# Patient Record
Sex: Female | Born: 1985
Health system: Southern US, Community
[De-identification: ages and names within clinical notes are randomized; demographics above are authoritative.]

## PROBLEM LIST (undated history)

## (undated) ENCOUNTER — Inpatient Hospital Stay (HOSPITAL_COMMUNITY): Payer: Self-pay

## (undated) DIAGNOSIS — K219 Gastro-esophageal reflux disease without esophagitis: Secondary | ICD-10-CM

## (undated) DIAGNOSIS — K8020 Calculus of gallbladder without cholecystitis without obstruction: Secondary | ICD-10-CM

## (undated) DIAGNOSIS — I10 Essential (primary) hypertension: Secondary | ICD-10-CM

---
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
## 2006-05-26 ENCOUNTER — Inpatient Hospital Stay (HOSPITAL_COMMUNITY): Admission: AD | Admit: 2006-05-26 | Discharge: 2006-05-26 | Payer: Self-pay | Admitting: Obstetrics

## 2006-06-18 ENCOUNTER — Ambulatory Visit (HOSPITAL_COMMUNITY): Admission: RE | Admit: 2006-06-18 | Discharge: 2006-06-18 | Payer: Self-pay | Admitting: Obstetrics

## 2006-09-29 ENCOUNTER — Inpatient Hospital Stay (HOSPITAL_COMMUNITY): Admission: AD | Admit: 2006-09-29 | Discharge: 2006-09-29 | Payer: Self-pay | Admitting: Obstetrics

## 2006-10-23 ENCOUNTER — Inpatient Hospital Stay (HOSPITAL_COMMUNITY): Admission: AD | Admit: 2006-10-23 | Discharge: 2006-10-23 | Payer: Self-pay | Admitting: Obstetrics & Gynecology

## 2006-10-25 ENCOUNTER — Inpatient Hospital Stay (HOSPITAL_COMMUNITY): Admission: AD | Admit: 2006-10-25 | Discharge: 2006-10-25 | Payer: Self-pay | Admitting: Obstetrics & Gynecology

## 2006-10-26 ENCOUNTER — Inpatient Hospital Stay (HOSPITAL_COMMUNITY): Admission: AD | Admit: 2006-10-26 | Discharge: 2006-10-29 | Payer: Self-pay | Admitting: Obstetrics & Gynecology

## 2006-10-26 DIAGNOSIS — O321XX0 Maternal care for breech presentation, not applicable or unspecified: Secondary | ICD-10-CM

## 2008-02-28 ENCOUNTER — Emergency Department (HOSPITAL_COMMUNITY): Admission: EM | Admit: 2008-02-28 | Discharge: 2008-02-28 | Payer: Self-pay | Admitting: Emergency Medicine

## 2008-05-01 ENCOUNTER — Inpatient Hospital Stay (HOSPITAL_COMMUNITY): Admission: AD | Admit: 2008-05-01 | Discharge: 2008-05-01 | Payer: Self-pay | Admitting: Obstetrics

## 2008-07-29 ENCOUNTER — Ambulatory Visit (HOSPITAL_COMMUNITY): Admission: RE | Admit: 2008-07-29 | Discharge: 2008-07-29 | Payer: Self-pay | Admitting: Obstetrics

## 2008-09-29 ENCOUNTER — Inpatient Hospital Stay (HOSPITAL_COMMUNITY): Admission: AD | Admit: 2008-09-29 | Discharge: 2008-09-29 | Payer: Self-pay | Admitting: Obstetrics & Gynecology

## 2008-12-15 ENCOUNTER — Inpatient Hospital Stay (HOSPITAL_COMMUNITY): Admission: AD | Admit: 2008-12-15 | Discharge: 2008-12-18 | Payer: Self-pay | Admitting: Obstetrics

## 2009-06-27 ENCOUNTER — Inpatient Hospital Stay (HOSPITAL_COMMUNITY): Admission: AD | Admit: 2009-06-27 | Discharge: 2009-06-27 | Payer: Self-pay | Admitting: Obstetrics & Gynecology

## 2010-04-03 ENCOUNTER — Inpatient Hospital Stay (HOSPITAL_COMMUNITY): Admission: AD | Admit: 2010-04-03 | Discharge: 2010-04-03 | Payer: Self-pay | Admitting: Obstetrics

## 2011-02-12 ENCOUNTER — Inpatient Hospital Stay (HOSPITAL_COMMUNITY)
Admission: AD | Admit: 2011-02-12 | Discharge: 2011-02-12 | Disposition: A | Discharge status: Home / Self Care | Payer: Medicaid Other | Source: Ambulatory Visit | Attending: Obstetrics | Admitting: Obstetrics

## 2011-02-12 DIAGNOSIS — N390 Urinary tract infection, site not specified: Secondary | ICD-10-CM | POA: Insufficient documentation

## 2011-02-12 DIAGNOSIS — B373 Candidiasis of vulva and vagina: Secondary | ICD-10-CM | POA: Insufficient documentation

## 2011-02-12 DIAGNOSIS — B3731 Acute candidiasis of vulva and vagina: Secondary | ICD-10-CM | POA: Insufficient documentation

## 2011-02-12 LAB — URINALYSIS, ROUTINE W REFLEX MICROSCOPIC
Bilirubin Urine: NEGATIVE
Hgb urine dipstick: NEGATIVE
Nitrite: NEGATIVE
Protein, ur: NEGATIVE mg/dL
Specific Gravity, Urine: 1.015 (ref 1.005–1.030)
Urobilinogen, UA: 0.2 mg/dL (ref 0.0–1.0)

## 2011-02-12 LAB — POCT PREGNANCY, URINE: Preg Test, Ur: NEGATIVE

## 2011-02-12 LAB — WET PREP, GENITAL: Clue Cells Wet Prep HPF POC: NONE SEEN

## 2011-02-14 LAB — GC/CHLAMYDIA PROBE AMP, GENITAL: Chlamydia, DNA Probe: POSITIVE — AB

## 2011-02-26 LAB — URINE CULTURE: Colony Count: >=100000

## 2011-02-26 LAB — URINALYSIS, ROUTINE W REFLEX MICROSCOPIC
Ketones, ur: NEGATIVE mg/dL
Nitrite: POSITIVE — AB
Protein, ur: NEGATIVE mg/dL
Urobilinogen, UA: 0.2 mg/dL (ref 0.0–1.0)

## 2011-02-26 LAB — WET PREP, GENITAL: Yeast Wet Prep HPF POC: NONE SEEN

## 2011-02-26 LAB — URINE MICROSCOPIC-ADD ON

## 2011-03-17 LAB — COMPREHENSIVE METABOLIC PANEL
AST: 14 U/L (ref 0–37)
CO2: 25 mEq/L (ref 19–32)
Calcium: 9 mg/dL (ref 8.4–10.5)
Creatinine, Ser: 0.49 mg/dL (ref 0.4–1.2)
GFR calc Af Amer: >60 mL/min (ref >60)
GFR calc non Af Amer: >60 mL/min (ref >60)
Glucose, Bld: 77 mg/dL (ref 70–99)

## 2011-03-17 LAB — URINE MICROSCOPIC-ADD ON

## 2011-03-17 LAB — CBC
MCHC: 33.7 g/dL (ref 30.0–36.0)
MCV: 89.9 fL (ref 78.0–100.0)
RBC: 3.81 MIL/uL — ABNORMAL LOW (ref 3.87–5.11)
RDW: 15.3 % (ref 11.5–15.5)

## 2011-03-17 LAB — URINALYSIS, ROUTINE W REFLEX MICROSCOPIC
Glucose, UA: NEGATIVE mg/dL
Protein, ur: NEGATIVE mg/dL
Specific Gravity, Urine: 1.01 (ref 1.005–1.030)
pH: 7 (ref 5.0–8.0)

## 2011-03-17 LAB — HCG, QUANTITATIVE, PREGNANCY: hCG, Beta Chain, Quant, S: 29811 m[IU]/mL — ABNORMAL HIGH (ref <5)

## 2011-03-17 LAB — ABO/RH: ABO/RH(D): O POS

## 2011-03-17 LAB — GC/CHLAMYDIA PROBE AMP, GENITAL: GC Probe Amp, Genital: NEGATIVE

## 2011-03-25 LAB — DIFFERENTIAL
Basophils Absolute: 0 10*3/uL (ref 0.0–0.1)
Basophils Relative: 0 % (ref 0–1)
Eosinophils Absolute: 0 10*3/uL (ref 0.0–0.7)
Monocytes Absolute: 0.7 10*3/uL (ref 0.1–1.0)
Neutro Abs: 6.4 10*3/uL (ref 1.7–7.7)
Neutrophils Relative %: 71 % (ref 43–77)

## 2011-03-25 LAB — CBC
HCT: 33.1 % — ABNORMAL LOW (ref 36.0–46.0)
Hemoglobin: 10.9 g/dL — ABNORMAL LOW (ref 12.0–15.0)
Hemoglobin: 9 g/dL — ABNORMAL LOW (ref 12.0–15.0)
MCHC: 33 g/dL (ref 30.0–36.0)
MCHC: 33.7 g/dL (ref 30.0–36.0)
MCV: 91.7 fL (ref 78.0–100.0)
MCV: 92.4 fL (ref 78.0–100.0)
RBC: 3.61 MIL/uL — ABNORMAL LOW (ref 3.87–5.11)
RDW: 13.6 % (ref 11.5–15.5)

## 2011-03-25 LAB — RUBELLA SCREEN: Rubella: 53.8 IU/mL — ABNORMAL HIGH

## 2011-04-26 NOTE — Op Note (Signed)
Dana Edwards, Dana Edwards              ACCOUNT NO.:  [id number]   MEDICAL RECORD NO.:  [id number]          PATIENT TYPE:  INP   LOCATION:  9103                          FACILITY:  [facility]   PHYSICIAN:  Roseanna Rainbow, M.D.DATE OF BIRTH:  1986/08/14   DATE OF PROCEDURE:  10/26/2006  DATE OF DISCHARGE:                                 OPERATIVE REPORT   PREOPERATIVE DIAGNOSIS:  Intrauterine pregnancy at term, active labor,  breech presentation.   POSTOPERATIVE DIAGNOSIS:  Intrauterine pregnancy at term, active labor,  breech presentation.   PROCEDURE:  Primary low uterine flap elliptical cesarean delivery via  Pfannenstiel skin incision.   SURGEON:  Roseanna Rainbow, M.D.   ANESTHESIA:  Spinal.   ESTIMATED BLOOD LOSS:  600 mL.   COMPLICATIONS:  None.   PROCEDURE:  The patient was taken to the operating room with an IV running.  She was given a spinal anesthetic and placed in the dorsal supine position  with a leftward tilt.  She was prepped and draped in the usual sterile  fashion.  The Pfannenstiel skin incision was then made with the scalpel and  carried down to the underlying fascia with the Bovie.  The fascia was nicked  in the midline.  The fascial incision was then extended bilaterally with  curved Mayo scissors.  The superior aspect of the fascial incision was  tented up and the underlying rectus muscles dissected off.  The inferior  aspect of the fascial incision was manipulated in a similar fashion.  Rectus  muscles were separated in the midline.  The parietal peritoneum was tented  up and entered sharply with Metzenbaum scissors.  This incision was then  extended superiorly and inferiorly with good visualization of the bladder.  The bladder blade was then placed.  The vesicouterine peritoneum was tented  up and entered sharply with Metzenbaum scissors.  This incision was then  extended bilaterally and the bladder flap created bluntly.  The bladder  blade was then  replaced.  The lower uterine segment was incised in a  transverse fashion with the scalpel.  The uterine incision was then extended  bluntly.  A complete breech extraction was then performed.  The infant's  head was delivered atraumatically.  The oropharynx was suctioned with bulb  suction.  The cord was clamped and cut.  The infant was handed off to the  awaiting neonatologist.  The placenta was then removed.  The intrauterine  cavity was evacuated of any remaining amniotic fluid, clots and debris with  a moist laparotomy sponge.  The uterine incision was then reapproximated in  a running interlocking fashion using 0 Monocryl.  A second imbricating  suture of the same was then placed.  The pericolic gutters were then  copiously irrigated.  The parietal peritoneum was reapproximated in a  running fashion using 2-0  Vicryl.  The fascia was closed in a running fashion using 0 Vicryl.  The  skin was reapproximated with staples.  At the close of the procedure the  instrument and pack counts were said to be correct times two.  One gram of  cefazolin was given at cord clamp.  The patient was taken to the PACU, awake  and in stable condition.      Roseanna Rainbow, M.D.  Electronically Signed     LAJ/MEDQ  D:  10/26/2006  T:  10/26/2006  Job:  [id number]

## 2011-04-26 NOTE — Discharge Summary (Signed)
Dana Edwards, Dana Edwards              ACCOUNT NO.:  [id number]   MEDICAL RECORD NO.:  [id number]          PATIENT TYPE:  INP   LOCATION:  9103                          FACILITY:  [facility]   PHYSICIAN:  Charles A. Clearance Coots, M.D.DATE OF BIRTH:  23-Jan-1986   DATE OF ADMISSION:  10/26/2006  DATE OF DISCHARGE:  10/29/2006                               DISCHARGE SUMMARY   ADMITTING DIAGNOSES:  1. Term pregnancy.  2. Spontaneous rupture of membranes.  3. Uterine contractions.   DISCHARGE DIAGNOSES:  1. Term pregnancy.  2. Spontaneous rupture of membranes.  3. Uterine contractions.  4. Breech presentation.  5. Status post primary low transverse cesarean section for breech      presentation in labor.  Viable female was delivered on October 26, 2006, at 1305.  Apgars of 8 at one minute, 9 at five minutes.      Weight of 3620 g, length of 52 cm.  Mother and infant discharged      home in good condition.   REASON FOR ADMISSION:  A 25 year old primigravida with estimated date of  confinement of October 27, 2006, presents with complaint of spontaneous  rupture of membranes and uterine contractions.  The patient was admitted  to labor and delivery.   PAST MEDICAL HISTORY:  Surgery:  None.  Illnesses:  None.   MEDICATIONS:  Prenatal vitamins.   ALLERGIES:  No known drug allergies.   SOCIAL HISTORY:  Single.  Negative for tobacco, alcohol or recreational  drug use.   GYNECOLOGICAL HISTORY:  Normal pap smear, positive history of chlamydia  infection that was treated.   PHYSICAL EXAMINATION:  GENERAL:  Well-nourished, well-developed female  in no acute distress.  VITAL SIGNS:  Afebrile, vital signs were stable.  LUNGS:  Clear to auscultation bilaterally.  HEART:  Regular rate and rhythm.  ABDOMEN:  Gravid, nontender.  CERVIX:  On cervical exam the presentation was thought to be not vertex  and ultrasound was performed and the presentation was found to be breech  and the patient was  therefore taken for cesarean section delivery for  breech presentation in active labor.  Cervix was 4 cm, 60% effaced, and  the breech was at a 0 station.   ADMITTING LABORATORY VALUES:  Hemoglobin 11.3; hematocrit 32.2; white  blood cell count 9200; platelets 250,000.   HOSPITAL COURSE:  The patient was taken to the operating room for  primary low transverse cesarean section for breech presentation in  active labor.  Primary low transverse cesarean section was performed on  October 26, 2006, without complications.  The postoperative course was  uncomplicated.  The patient was discharged home on postoperative day #3  in good condition.   DISCHARGE LABORATORY VALUES:  Hemoglobin 10.5; hematocrit 30.7; white  blood cell count 9500; platelets 211,000.   DISCHARGE DISPOSITION:  Medications:  Continue prenatal vitamins.  Tylox  and ibuprofen was prescribed for pain.  Routine written instructions  were given for discharge after cesarean section.  The patient is to call  the office for followup appointment in 2 weeks.  Charles A. Clearance Coots, M.D.  Electronically Signed     CAH/MEDQ  D:  11/27/2006  T:  11/27/2006  Job:  [id number]

## 2011-08-13 ENCOUNTER — Emergency Department (HOSPITAL_COMMUNITY): Payer: Self-pay

## 2011-08-13 ENCOUNTER — Emergency Department (HOSPITAL_COMMUNITY)
Admission: EM | Admit: 2011-08-13 | Discharge: 2011-08-13 | Disposition: A | Discharge status: Home / Self Care | Payer: Self-pay | Attending: Emergency Medicine | Admitting: Emergency Medicine

## 2011-08-13 DIAGNOSIS — S91109A Unspecified open wound of unspecified toe(s) without damage to nail, initial encounter: Secondary | ICD-10-CM | POA: Insufficient documentation

## 2011-08-13 DIAGNOSIS — X58XXXA Exposure to other specified factors, initial encounter: Secondary | ICD-10-CM | POA: Insufficient documentation

## 2011-08-16 ENCOUNTER — Emergency Department (HOSPITAL_COMMUNITY)
Admission: EM | Admit: 2011-08-16 | Discharge: 2011-08-16 | Disposition: A | Discharge status: Home / Self Care | Payer: Self-pay | Attending: Emergency Medicine | Admitting: Emergency Medicine

## 2011-08-16 DIAGNOSIS — S91109A Unspecified open wound of unspecified toe(s) without damage to nail, initial encounter: Secondary | ICD-10-CM | POA: Insufficient documentation

## 2011-08-16 DIAGNOSIS — X500XXA Overexertion from strenuous movement or load, initial encounter: Secondary | ICD-10-CM | POA: Insufficient documentation

## 2011-09-04 LAB — URINE MICROSCOPIC-ADD ON

## 2011-09-04 LAB — CBC
HCT: 38.1
Hemoglobin: 13
MCHC: 34.2
MCV: 93.3
Platelets: 289
RDW: 12.1

## 2011-09-04 LAB — GC/CHLAMYDIA PROBE AMP, GENITAL
Chlamydia, DNA Probe: POSITIVE — AB
GC Probe Amp, Genital: POSITIVE — AB

## 2011-09-04 LAB — URINALYSIS, ROUTINE W REFLEX MICROSCOPIC
Bilirubin Urine: NEGATIVE
Ketones, ur: NEGATIVE
Protein, ur: NEGATIVE
Urobilinogen, UA: 0.2

## 2011-09-04 LAB — POCT PREGNANCY, URINE
Operator id: 14935
Preg Test, Ur: POSITIVE

## 2011-09-04 LAB — WET PREP, GENITAL: Yeast Wet Prep HPF POC: NONE SEEN

## 2011-09-09 LAB — URINALYSIS, ROUTINE W REFLEX MICROSCOPIC
Glucose, UA: NEGATIVE
Hgb urine dipstick: NEGATIVE
Ketones, ur: NEGATIVE
Protein, ur: 30 — AB
pH: 6.5

## 2011-09-09 LAB — WET PREP, GENITAL: Clue Cells Wet Prep HPF POC: NONE SEEN

## 2011-09-09 LAB — URINE MICROSCOPIC-ADD ON

## 2012-01-01 ENCOUNTER — Emergency Department (HOSPITAL_COMMUNITY)
Admission: EM | Admit: 2012-01-01 | Discharge: 2012-01-01 | Disposition: A | Discharge status: Home / Self Care | Payer: Self-pay | Attending: Emergency Medicine | Admitting: Emergency Medicine

## 2012-01-01 ENCOUNTER — Emergency Department (HOSPITAL_COMMUNITY): Payer: Self-pay

## 2012-01-01 ENCOUNTER — Other Ambulatory Visit: Payer: Self-pay

## 2012-01-01 ENCOUNTER — Encounter (HOSPITAL_COMMUNITY): Payer: Self-pay | Admitting: *Deleted

## 2012-01-01 DIAGNOSIS — R079 Chest pain, unspecified: Secondary | ICD-10-CM | POA: Insufficient documentation

## 2012-01-01 DIAGNOSIS — M940 Chondrocostal junction syndrome [Tietze]: Secondary | ICD-10-CM | POA: Insufficient documentation

## 2012-01-01 MED ORDER — NAPROXEN 500 MG PO TABS: 500.0000 mg | ORAL_TABLET | Freq: Two times a day (BID) | ORAL | Status: DC

## 2012-01-01 MED ORDER — IBUPROFEN 800 MG PO TABS
800.0000 mg | ORAL_TABLET | Freq: Once | ORAL | Status: AC
  Administered 2012-01-01: 800 mg via ORAL
  Filled 2012-01-01: qty 1

## 2012-01-01 MED ORDER — BISACODYL 5 MG PO TBEC: 5.0000 mg | DELAYED_RELEASE_TABLET | Freq: Two times a day (BID) | ORAL | Status: DC

## 2012-01-01 MED ORDER — TRAMADOL HCL 50 MG PO TABS: 50.0000 mg | ORAL_TABLET | Freq: Four times a day (QID) | ORAL | Status: AC | PRN

## 2012-01-01 MED ORDER — OXYCODONE-ACETAMINOPHEN 5-325 MG PO TABS
2.0000 | ORAL_TABLET | Freq: Once | ORAL | Status: AC
  Administered 2012-01-01: 2 via ORAL
  Filled 2012-01-01: qty 2

## 2012-01-01 NOTE — ED Provider Notes (Signed)
History     CSN: [id number]  Arrival date & time 01/01/12  1222   First MD Initiated Contact with Patient 01/01/12 1240      Chief Complaint  Patient presents with  . Chest Pain    (Consider location/radiation/quality/duration/timing/severity/associated sxs/prior treatment) Patient is a 26 y.o. female presenting with chest pain. The history is provided by the patient. No language interpreter was used.  Chest Pain The chest pain began 2 days ago. Duration of episode(s) is 2 days. Chest pain occurs constantly. The chest pain is worsening. The pain is associated with breathing. The severity of the pain is moderate. The quality of the pain is described as aching and sharp. The pain does not radiate. Exacerbated by: no specific exacerbating measures. Pertinent negatives for primary symptoms include no fever, no fatigue, no shortness of breath, no cough, no palpitations, no abdominal pain, no nausea, no vomiting and no dizziness.  Pertinent negatives for associated symptoms include no numbness and no weakness. She tried nothing for the symptoms. Risk factors include obesity.     History reviewed. No pertinent past medical history.  History reviewed. No pertinent past surgical history.  No family history on file.  History  Substance Use Topics  . Smoking status: Never Smoker   . Smokeless tobacco: Not on file  . Alcohol Use: No    OB History    Grav Para Term Preterm Abortions TAB SAB Ect Mult Living                  Review of Systems  Constitutional: Negative for fever, chills, activity change, appetite change and fatigue.  HENT: Negative for congestion, sore throat, rhinorrhea, neck pain and neck stiffness.   Respiratory: Negative for cough and shortness of breath.   Cardiovascular: Positive for chest pain. Negative for palpitations.  Gastrointestinal: Negative for nausea, vomiting and abdominal pain.  Genitourinary: Negative for dysuria, urgency, frequency and flank pain.   Neurological: Negative for dizziness, weakness, light-headedness, numbness and headaches.  All other systems reviewed and are negative.    Allergies  Review of patient's allergies indicates no known allergies.  Home Medications   Current Outpatient Rx  Name Route Sig Dispense Refill  . NAPROXEN 500 MG PO TABS Oral Take 1 tablet (500 mg total) by mouth 2 (two) times daily. 30 tablet 0  . TRAMADOL HCL 50 MG PO TABS Oral Take 1 tablet (50 mg total) by mouth every 6 (six) hours as needed for pain. 15 tablet 0    BP 107/63  Pulse 85  Temp(Src) 98.9 F (37.2 C) (Oral)  Resp 16  SpO2 100%  LMP 12/31/2011  Physical Exam  Nursing note and vitals reviewed. Constitutional: She is oriented to person, place, and time. She appears well-developed and well-nourished. No distress.  HENT:  Head: Normocephalic and atraumatic.  Mouth/Throat: Oropharynx is clear and moist. No oropharyngeal exudate.  Eyes: Conjunctivae and EOM are normal. Pupils are equal, round, and reactive to light.  Neck: Normal range of motion. Neck supple.  Cardiovascular: Normal rate, regular rhythm, normal heart sounds and intact distal pulses.  Exam reveals no gallop and no friction rub.   No murmur heard. Pulmonary/Chest: Effort normal and breath sounds normal. No respiratory distress. She exhibits tenderness.  Abdominal: Soft. Bowel sounds are normal. There is no tenderness.  Musculoskeletal: Normal range of motion. She exhibits no tenderness.  Neurological: She is alert and oriented to person, place, and time. No cranial nerve deficit.  Skin: Skin is warm and  dry.    ED Course  Procedures (including critical care time)   Date: 01/01/2012  Rate: 74  Rhythm: normal sinus rhythm  QRS Axis: normal  Intervals: normal  ST/T Wave abnormalities: normal  Conduction Disutrbances:none  Narrative Interpretation:   Old EKG Reviewed: none available  Labs Reviewed - No data to display Dg Chest 2 View  01/01/2012   *RADIOLOGY REPORT*  Clinical Data: Mid chest pain.  CHEST - 2 VIEW  Comparison: None.  Findings: Cardiac and mediastinal contours appear normal.  The lungs appear clear.  No pleural effusion is identified.  IMPRESSION:  No significant abnormality identified.  Original Report Authenticated By: Dellia Cloud, M.D.     1. Costochondritis       MDM  PERC negative with low clinical Gestalt for PE. I have no concern about a cardiac etiology of her chest pain. EKG and chest x-ray are negative. Feel her symptoms are secondary costochondritis. Her symptoms will be controlled with pain medication. To be discharged home on Naprosyn and tramadol        Dayton Bailiff, MD 01/01/12 1326

## 2012-01-01 NOTE — ED Notes (Signed)
Pt also asking if she can be pregnant even if she is having what she believes to be her normal menstrual cycle. Pt requesting pregnancy test.

## 2012-01-01 NOTE — ED Notes (Signed)
Pt reports chest pain that began two days ago. C/o central chest, aching. No cardiac Hx, denies anxiety Hx. Reports n/v x4 this am.

## 2012-06-06 ENCOUNTER — Emergency Department (HOSPITAL_COMMUNITY)
Admission: EM | Admit: 2012-06-06 | Discharge: 2012-06-06 | Disposition: A | Discharge status: Home / Self Care | Payer: Medicaid Other | Attending: Emergency Medicine | Admitting: Emergency Medicine

## 2012-06-06 ENCOUNTER — Encounter (HOSPITAL_COMMUNITY): Payer: Self-pay

## 2012-06-06 DIAGNOSIS — L02419 Cutaneous abscess of limb, unspecified: Secondary | ICD-10-CM | POA: Insufficient documentation

## 2012-06-06 DIAGNOSIS — L0291 Cutaneous abscess, unspecified: Secondary | ICD-10-CM

## 2012-06-06 MED ORDER — CEPHALEXIN 500 MG PO CAPS: 500.0000 mg | ORAL_CAPSULE | Freq: Four times a day (QID) | ORAL | Status: AC

## 2012-06-06 MED ORDER — LIDOCAINE-EPINEPHRINE 1 %-1:100000 IJ SOLN
INTRAMUSCULAR | Status: AC
  Administered 2012-06-06: 10:00:00
  Filled 2012-06-06: qty 1

## 2012-06-06 MED ORDER — LIDOCAINE-EPINEPHRINE 2 %-1:100000 IJ SOLN
20.0000 mL | Freq: Once | INTRAMUSCULAR | Status: AC
  Administered 2012-06-06: 20 mL
  Filled 2012-06-06: qty 20

## 2012-06-06 MED ORDER — SULFAMETHOXAZOLE-TRIMETHOPRIM 800-160 MG PO TABS: 1.0000 | ORAL_TABLET | Freq: Two times a day (BID) | ORAL | Status: AC

## 2012-06-06 NOTE — ED Provider Notes (Signed)
History     CSN: [id number]  Arrival date & time 06/06/12  0920   First MD Initiated Contact with Patient 06/06/12 [phone]      Chief Complaint  Patient presents with  . Skin Ulcer    (Consider location/radiation/quality/duration/timing/severity/associated sxs/prior treatment) HPI Comments: Pain and swelling to L inner lower leg x 2 days.  Shaved legs 5 days ago.  Noticed 2 red, swollen draining areas 2 days ago.  No fever, vomiting.  No history of abscesses.    The history is provided by the patient.    History reviewed. No pertinent past medical history.  History reviewed. No pertinent past surgical history.  History reviewed. No pertinent family history.  History  Substance Use Topics  . Smoking status: Never Smoker   . Smokeless tobacco: Not on file  . Alcohol Use: No    OB History    Grav Para Term Preterm Abortions TAB SAB Ect Mult Living                  Review of Systems  Constitutional: Negative for fever, activity change and appetite change.  HENT: Negative for congestion and rhinorrhea.   Respiratory: Negative for cough, chest tightness and shortness of breath.   Cardiovascular: Negative for chest pain.  Gastrointestinal: Negative for nausea, vomiting and abdominal pain.  Genitourinary: Negative for dysuria.  Musculoskeletal: Negative for back pain.  Skin: Positive for wound.  Neurological: Negative for dizziness, light-headedness and headaches.    Allergies  Review of patient's allergies indicates no known allergies.  Home Medications   Current Outpatient Rx  Name Route Sig Dispense Refill  . NAPROXEN 500 MG PO TABS Oral Take 1 tablet (500 mg total) by mouth 2 (two) times daily. 30 tablet 0    BP 117/83  Pulse 98  Temp 98.3 F (36.8 C) (Oral)  Resp 18  SpO2 100%  LMP 05/13/2012  Physical Exam  Constitutional: She is oriented to person, place, and time. She appears well-developed and well-nourished. No distress.  HENT:  Head:  Normocephalic and atraumatic.  Mouth/Throat: Oropharynx is clear and moist. No oropharyngeal exudate.  Eyes: Conjunctivae are normal. Pupils are equal, round, and reactive to light.  Neck: Normal range of motion. Neck supple.  Cardiovascular: Normal rate, regular rhythm and normal heart sounds.   No murmur heard. Pulmonary/Chest: Effort normal and breath sounds normal. No respiratory distress.  Abdominal: Soft. There is no tenderness. There is no rebound and no guarding.  Musculoskeletal: Normal range of motion. She exhibits no edema and no tenderness.       Medial surface of left lower leg has 2 pustules draining pus. There is surrounding erythema, induration or fluctuance.  Neurological: She is alert and oriented to person, place, and time. No cranial nerve deficit.  Skin: Skin is warm.    ED Course  INCISION AND DRAINAGE Date/Time: 06/06/2012 10:02 AM Performed by: Glynn Octave Authorized by: Glynn Octave Consent: Verbal consent obtained. Risks and benefits: risks, benefits and alternatives were discussed Consent given by: patient Patient understanding: patient states understanding of the procedure being performed Patient consent: the patient's understanding of the procedure matches consent given Patient identity confirmed: verbally with patient Time out: Immediately prior to procedure a "time out" was called to verify the correct patient, procedure, equipment, support staff and site/side marked as required. Type: abscess Body area: lower extremity Location details: left leg Anesthesia: local infiltration Local anesthetic: lidocaine 1% with epinephrine Anesthetic total: 8 ml Patient sedated: no Scalpel size:  11 Needle gauge: 20 Incision type: single straight Complexity: complex Drainage: purulent Drainage amount: copious Packing material: 1/4 in iodoform gauze Patient tolerance: Patient tolerated the procedure well with no immediate complications.  INCISION AND  DRAINAGE Date/Time: 06/06/2012 10:03 AM Performed by: Glynn Octave Authorized by: Glynn Octave Consent: Verbal consent obtained. Risks and benefits: risks, benefits and alternatives were discussed Consent given by: patient Patient understanding: patient states understanding of the procedure being performed Patient consent: the patient's understanding of the procedure matches consent given Patient identity confirmed: verbally with patient Time out: Immediately prior to procedure a "time out" was called to verify the correct patient, procedure, equipment, support staff and site/side marked as required. Type: abscess Body area: lower extremity Location details: left leg Anesthesia: local infiltration Local anesthetic: lidocaine 1% with epinephrine Anesthetic total: 8 ml Patient sedated: no Scalpel size: 11 Needle gauge: 20 Incision type: single straight Complexity: simple Drainage: purulent Drainage amount: moderate Packing material: 1/4 in iodoform gauze Patient tolerance: Patient tolerated the procedure well with no immediate complications.   (including critical care time)  Labs Reviewed - No data to display No results found.   No diagnosis found.    MDM  Abscess to L lower leg with surrounding cellulitis. No systemic symptoms.   I+D as above.  Antibiotics, warm soaks, recheck 48 hours.      Glynn Octave, MD 06/06/12 1004

## 2012-06-06 NOTE — ED Notes (Signed)
Pt shaved legs Monday, Tuesday developed a pimple on the left inner calf

## 2012-06-06 NOTE — ED Notes (Signed)
Brought i/o kit for dr to use on pt. 9:35amJG.

## 2012-06-06 NOTE — Discharge Instructions (Signed)
Abscess Return in 48 hours for a wound check. Take the antibiotics as prescribed. Return to the ED if you develop new or worsening symptoms. An abscess (boil or furuncle) is an infected area that contains a collection of pus.  SYMPTOMS Signs and symptoms of an abscess include pain, tenderness, redness, or hardness. You may feel a moveable soft area under your skin. An abscess can occur anywhere in the body.  TREATMENT  A surgical cut (incision) may be made over your abscess to drain the pus. Gauze may be packed into the space or a drain may be looped through the abscess cavity (pocket). This provides a drain that will allow the cavity to heal from the inside outwards. The abscess may be painful for a few days, but should feel much better if it was drained.  Your abscess, if seen early, may not have localized and may not have been drained. If not, another appointment may be required if it does not get better on its own or with medications. HOME CARE INSTRUCTIONS   Only take over-the-counter or prescription medicines for pain, discomfort, or fever as directed by your caregiver.   Take your antibiotics as directed if they were prescribed. Finish them even if you start to feel better.   Keep the skin and clothes clean around your abscess.   If the abscess was drained, you will need to use gauze dressing to collect any draining pus. Dressings will typically need to be changed 3 or more times a day.   The infection may spread by skin contact with others. Avoid skin contact as much as possible.   Practice good hygiene. This includes regular hand washing, cover any draining skin lesions, and do not share personal care items.   If you participate in sports, do not share athletic equipment, towels, whirlpools, or personal care items. Shower after every practice or tournament.   If a draining area cannot be adequately covered:   Do not participate in sports.   Children should not participate in day  care until the wound has healed or drainage stops.   If your caregiver has given you a follow-up appointment, it is very important to keep that appointment. Not keeping the appointment could result in a much worse infection, chronic or permanent injury, pain, and disability. If there is any problem keeping the appointment, you must call back to this facility for assistance.  SEEK MEDICAL CARE IF:   You develop increased pain, swelling, redness, drainage, or bleeding in the wound site.   You develop signs of generalized infection including muscle aches, chills, fever, or a general ill feeling.   You have an oral temperature above 102 F (38.9 C).  MAKE SURE YOU:   Understand these instructions.   Will watch your condition.   Will get help right away if you are not doing well or get worse.  Document Released: 09/04/2005 Document Revised: 11/14/2011 Document Reviewed: 06/28/2008 [facility] Patient Information 2012 [place], [place].

## 2012-06-09 ENCOUNTER — Emergency Department (HOSPITAL_COMMUNITY)
Admission: EM | Admit: 2012-06-09 | Discharge: 2012-06-09 | Disposition: A | Discharge status: Home / Self Care | Payer: Medicaid Other | Attending: Emergency Medicine | Admitting: Emergency Medicine

## 2012-06-09 ENCOUNTER — Encounter (HOSPITAL_COMMUNITY): Payer: Self-pay | Admitting: *Deleted

## 2012-06-09 DIAGNOSIS — Z5189 Encounter for other specified aftercare: Secondary | ICD-10-CM

## 2012-06-09 DIAGNOSIS — Z4801 Encounter for change or removal of surgical wound dressing: Secondary | ICD-10-CM | POA: Insufficient documentation

## 2012-06-09 NOTE — ED Provider Notes (Signed)
History   This chart was scribed for Gwyneth Sprout, MD by Melba Coon. The patient was seen in room TR04C/TR04C and the patient's care was started at 12:14PM.    CSN: [id number]  Arrival date & time 06/09/12  1151   First MD Initiated Contact with Patient 06/09/12 1207      Chief Complaint  Patient presents with  . Wound Check    (Consider location/radiation/quality/duration/timing/severity/associated sxs/prior treatment) HPI Dana Edwards is a 26 y.o. female who presents to the Emergency Department for a wound recheck of persistent, moderate to severe abscess of the LLE with an onset 4 days ago. Pt received an I&D at onset. Pt was supposed to come back to the ED yesterday. Redness around affected area is alleviated; Rx Keflex and Bactrim alleviated the s/s. No HA, fever, neck pain, sore throat, back pain, CP, SOB, abd pain, n/v/d, dysuria, or extremity pain, edema, weakness, numbness, or tingling. No known allergies. No other pertinent medical symptoms.  History reviewed. No pertinent past medical history.  History reviewed. No pertinent past surgical history.  No family history on file.  History  Substance Use Topics  . Smoking status: Never Smoker   . Smokeless tobacco: Not on file  . Alcohol Use: No    OB History    Grav Para Term Preterm Abortions TAB SAB Ect Mult Living                  Review of Systems 10 Systems reviewed and all are negative for acute change except as noted in the HPI.   Allergies  Review of patient's allergies indicates no known allergies.  Home Medications   Current Outpatient Rx  Name Route Sig Dispense Refill  . CEPHALEXIN 500 MG PO CAPS Oral Take 1 capsule (500 mg total) by mouth 4 (four) times daily. 40 capsule 0  . IBUPROFEN 200 MG PO TABS Oral Take 400 mg by mouth every 6 (six) hours as needed. For pain    . SULFAMETHOXAZOLE-TRIMETHOPRIM 800-160 MG PO TABS Oral Take 1 tablet by mouth 2 (two) times daily. 28 tablet 0     BP 112/66  Pulse 93  Temp 98 F (36.7 C) (Oral)  Resp 20  SpO2 100%  LMP 05/13/2012  Physical Exam  Nursing note and vitals reviewed. Constitutional: She is oriented to person, place, and time. She appears well-developed and well-nourished. No distress.  HENT:  Head: Normocephalic and atraumatic.  Eyes: EOM are normal.  Neck: Normal range of motion. Neck supple.  Musculoskeletal: Normal range of motion. She exhibits no edema and no tenderness.       Legs: Neurological: She is alert and oriented to person, place, and time.  Skin: Skin is warm and dry.       2 abscess on left medial leg with minimal surrounding erythema and mild flutuance that appears to be well-healing.  Psychiatric: She has a normal mood and affect. Her behavior is normal.    ED Course  Procedures (including critical care time)  DIAGNOSTIC STUDIES: Oxygen Saturation is 100% on room air, normal by my interpretation.    COORDINATION OF CARE:  12:16PM - EDMD will remove packing from wound; wound appears to be healing; pt is advised to continue similar care of affected area and not to shave until area is completely healed; wound will be re-wrapped; pt ready for d/c.   Labs Reviewed - No data to display No results found.   1. Wound check, abscess  MDM   Patient here for abscess recheck. Packing was removed and its improved cellulitis. Patient is displaying no systemic symptoms. She will complete her course of antibiotics and continues to the abscess to allow for complete healing. I personally performed the services described in this documentation, which was scribed in my presence.  The recorded information has been reviewed and considered.        Gwyneth Sprout, MD 06/09/12 1223

## 2012-06-09 NOTE — ED Notes (Signed)
Patient had an abcess lanced on Friday.  She is here for recheck.  Her abcess is located on her lower left leg

## 2014-12-09 NOTE — L&D Delivery Note (Cosign Needed)
Delivery Note  This is a 29 year old G 3 P3 who was admitted for Active phase labor. She progressed normally with epidural and 2Mu of Pitocin to the second stage of labor.  She pushed for 30 min.  At 12:24 PM She delivered a viable infant female, cephalic, over an intact perineum. VBAC, LOA position.   A nuchal cord was identified and easily reduced.  Also had a body cord.  Infant placed on maternal abdomen.  Delayed cord clamping for 2-3 minutes was done.  Cord double clamped and cut.  Apgar scores were 9 and 9. The placenta delivered spontaneously, shultz, with a 3 vessel cord.  Inspection revealed 1st degree. The uterus was firm bleeding stable after 1000 of cytotec recally.  The repair was done under epidural.   EBL was 600.    Placenta and umbilical artery blood gas were not sent.  There were no complications during the procedure.  Mom and baby skin to skin following delivery. Left in stable condition.  Anesthesia: Epidural  Episiotomy: None Lacerations: 1st degree;Perineal Suture Repair: 3.0 vicryl Est. Blood Loss (mL): 600  Mom to postpartum.  Baby to Couplet care / Skin to Skin.  Axell Trigueros A Kirstine Jacquin 09/21/2015, 1:24 PM

## 2015-04-26 ENCOUNTER — Inpatient Hospital Stay (HOSPITAL_COMMUNITY)
Admission: AD | Admit: 2015-04-26 | Discharge: 2015-04-26 | Disposition: A | Discharge status: Home / Self Care | Payer: Medicaid Other | Source: Ambulatory Visit | Attending: Obstetrics & Gynecology | Admitting: Obstetrics & Gynecology

## 2015-04-26 ENCOUNTER — Encounter (HOSPITAL_COMMUNITY): Payer: Self-pay | Admitting: *Deleted

## 2015-04-26 ENCOUNTER — Inpatient Hospital Stay (HOSPITAL_COMMUNITY): Payer: Medicaid Other

## 2015-04-26 DIAGNOSIS — Z3689 Encounter for other specified antenatal screening: Secondary | ICD-10-CM | POA: Insufficient documentation

## 2015-04-26 DIAGNOSIS — R109 Unspecified abdominal pain: Secondary | ICD-10-CM | POA: Diagnosis present

## 2015-04-26 DIAGNOSIS — O26899 Other specified pregnancy related conditions, unspecified trimester: Secondary | ICD-10-CM | POA: Insufficient documentation

## 2015-04-26 DIAGNOSIS — Z3A16 16 weeks gestation of pregnancy: Secondary | ICD-10-CM | POA: Insufficient documentation

## 2015-04-26 DIAGNOSIS — O2342 Unspecified infection of urinary tract in pregnancy, second trimester: Secondary | ICD-10-CM | POA: Diagnosis not present

## 2015-04-26 DIAGNOSIS — N390 Urinary tract infection, site not specified: Secondary | ICD-10-CM

## 2015-04-26 DIAGNOSIS — R1030 Lower abdominal pain, unspecified: Secondary | ICD-10-CM

## 2015-04-26 LAB — COMPREHENSIVE METABOLIC PANEL
ALBUMIN: 3.4 g/dL — AB (ref 3.5–5.0)
ALT: 40 U/L (ref 14–54)
ANION GAP: 7 (ref 5–15)
AST: 28 U/L (ref 15–41)
Alkaline Phosphatase: 54 U/L (ref 38–126)
BILIRUBIN TOTAL: 0.2 mg/dL — AB (ref 0.3–1.2)
BUN: 6 mg/dL (ref 6–20)
CHLORIDE: 105 mmol/L (ref 101–111)
CO2: 25 mmol/L (ref 22–32)
CREATININE: 0.58 mg/dL (ref 0.44–1.00)
Calcium: 9 mg/dL (ref 8.9–10.3)
GFR calc Af Amer: >60 mL/min (ref >60)
GFR calc non Af Amer: >60 mL/min (ref >60)
Glucose, Bld: 74 mg/dL (ref 65–99)
Potassium: 3.9 mmol/L (ref 3.5–5.1)
Sodium: 137 mmol/L (ref 135–145)
TOTAL PROTEIN: 7.5 g/dL (ref 6.5–8.1)

## 2015-04-26 LAB — CBC
HEMATOCRIT: 34 % — AB (ref 36.0–46.0)
HEMOGLOBIN: 11.6 g/dL — AB (ref 12.0–15.0)
MCH: 30.6 pg (ref 26.0–34.0)
MCHC: 34.1 g/dL (ref 30.0–36.0)
MCV: 89.7 fL (ref 78.0–100.0)
Platelets: 271 10*3/uL (ref 150–400)
RBC: 3.79 MIL/uL — ABNORMAL LOW (ref 3.87–5.11)
RDW: 13.3 % (ref 11.5–15.5)
WBC: 6.4 10*3/uL (ref 4.0–10.5)

## 2015-04-26 LAB — WET PREP, GENITAL
Clue Cells Wet Prep HPF POC: NONE SEEN
TRICH WET PREP: NONE SEEN
YEAST WET PREP: NONE SEEN

## 2015-04-26 LAB — URINE MICROSCOPIC-ADD ON

## 2015-04-26 LAB — URINALYSIS, ROUTINE W REFLEX MICROSCOPIC
BILIRUBIN URINE: NEGATIVE
GLUCOSE, UA: NEGATIVE mg/dL
HGB URINE DIPSTICK: NEGATIVE
Ketones, ur: NEGATIVE mg/dL
Nitrite: POSITIVE — AB
PH: 5.5 (ref 5.0–8.0)
Protein, ur: NEGATIVE mg/dL
SPECIFIC GRAVITY, URINE: 1.015 (ref 1.005–1.030)
Urobilinogen, UA: 0.2 mg/dL (ref 0.0–1.0)

## 2015-04-26 LAB — ABO/RH: ABO/RH(D): O POS

## 2015-04-26 LAB — POCT PREGNANCY, URINE: Preg Test, Ur: POSITIVE — AB

## 2015-04-26 LAB — HCG, QUANTITATIVE, PREGNANCY: HCG, BETA CHAIN, QUANT, S: 33861 m[IU]/mL — AB (ref <5)

## 2015-04-26 MED ORDER — CEPHALEXIN 500 MG PO CAPS: 500.0000 mg | ORAL_CAPSULE | Freq: Four times a day (QID) | ORAL | Status: DC

## 2015-04-26 NOTE — MAU Provider Note (Signed)
History     CSN: [id number]  Arrival date and time: 04/26/15 1045   None     Chief Complaint  Patient presents with  . Abdominal Pain   [id number] Patient's last menstrual period was 02/11/2015 (approximate). 5675w4d by dates but neg UPT 1 week ago at home, Edwards/o epigastric pain 7/10 no nausea no vomiting no fever. Pos preg test today. No OBGyn currently, may be moving to another area soon.   Pertinent Gynecological History: Patient's last menstrual period was 02/11/2015 (approximate).   Past Medical History  Diagnosis Date  . Medical history non-contributory     OB History    Gravida Para Term Preterm AB TAB SAB Ectopic Multiple Living   3 2 2       2        No family history on file.  History  Substance Use Topics  . Smoking status: Never Smoker   . Smokeless tobacco: Not on file  . Alcohol Use: No    Allergies: No Known Allergies  Prescriptions prior to admission  Medication Sig Dispense Refill Last Dose  . ibuprofen (ADVIL,MOTRIN) 200 MG tablet Take 400 mg by mouth every 6 (six) hours as needed. For pain   04/26/2015 at Unknown time    Review of Systems  Constitutional: Negative.   Respiratory: Negative.   Gastrointestinal: Negative for heartburn, nausea and constipation.  Genitourinary: Negative.   Psychiatric/Behavioral: Negative.    Physical Exam   Blood pressure 123/74, pulse 66, temperature 97.4 F (36.3 Edwards), temperature source Oral, resp. rate 18, height 5' 5.5" (1.664 m), weight 112.038 kg (247 lb), last menstrual period 02/11/2015.  Physical Exam  Vitals reviewed. Constitutional: She appears well-developed. No distress.  Cardiovascular: Normal rate.   Respiratory: She is in respiratory distress.  GI: Soft. She exhibits no distension and no mass. There is no tenderness.  Genitourinary: No vaginal discharge found.  Pelvic exam: normal external genitalia, vulva, vagina, cervix, uterus and adnexa, uterus <6 weeks, diffulcut due to obesity   wet  prep and CT GC done Pos UPT  HCG 33861 Results for Dana MoronKINSON, Dana Edwards (MRN [id number]) as of 04/26/2015 16:17  Ref. Range 04/26/2015 11:00  Appearance Latest Ref Range: CLEAR  CLEAR  Bacteria, UA Latest Ref Range: RARE  FEW (A)  Bilirubin Urine Latest Ref Range: NEGATIVE  NEGATIVE  Color, Urine Latest Ref Range: YELLOW  YELLOW  Glucose Latest Ref Range: NEGATIVE mg/dL NEGATIVE  Hgb urine dipstick Latest Ref Range: NEGATIVE  NEGATIVE  Ketones, ur Latest Ref Range: NEGATIVE mg/dL NEGATIVE  Leukocytes, UA Latest Ref Range: NEGATIVE  LARGE (A)  Nitrite Latest Ref Range: NEGATIVE  POSITIVE (A)  pH Latest Ref Range: 5.0-8.0  5.5  Protein Latest Ref Range: NEGATIVE mg/dL NEGATIVE  Specific Gravity, Urine Latest Ref Range: 1.005-1.030  1.015  Squamous Epithelial / LPF Latest Ref Range: RARE  FEW (A)  Urobilinogen, UA Latest Ref Range: 0.0-1.0 mg/dL 0.2  WBC, UA Latest Ref Range: <3 WBC/hpf 7-10   CBC    Component Value Date/Time   WBC 6.4 04/26/2015 1223   RBC 3.79* 04/26/2015 1223   HGB 11.6* 04/26/2015 1223   HCT 34.0* 04/26/2015 1223   PLT 271 04/26/2015 1223   MCV 89.7 04/26/2015 1223   MCH 30.6 04/26/2015 1223   MCHC 34.1 04/26/2015 1223   RDW 13.3 04/26/2015 1223   LYMPHSABS 1.9 12/15/2008 1843   MONOABS 0.7 12/15/2008 1843   EOSABS 0.0 12/15/2008 1843   BASOSABS 0.0 12/15/2008 1843  CMP     Component Value Date/Time   NA 137 04/26/2015 1223   K 3.9 04/26/2015 1223   CL 105 04/26/2015 1223   CO2 25 04/26/2015 1223   GLUCOSE 74 04/26/2015 1223   BUN 6 04/26/2015 1223   CREATININE 0.58 04/26/2015 1223   CALCIUM 9.0 04/26/2015 1223   PROT 7.5 04/26/2015 1223   ALBUMIN 3.4* 04/26/2015 1223   AST 28 04/26/2015 1223   ALT 40 04/26/2015 1223   ALKPHOS 54 04/26/2015 1223   BILITOT 0.2* 04/26/2015 1223   GFRNONAA >60 04/26/2015 1223   GFRAA >60 04/26/2015 1223      US single IUP 16 weeks 3 days MAU Course  Procedures  MDM mod  Assessment and Plan  UTI, 16.[redacted]  weeks gestation. Urine culture ordered, Rx Keflex 500 mg QID 7 days, seek prenatal care ASAP  Brant Peets 04/26/2015, 12:11 PM

## 2015-04-26 NOTE — MAU Note (Signed)
Pain in upper abd for past couple wks, thought it was just cramps. Hasn't  Had a period since March. Neg home test a wk ago.

## 2015-04-26 NOTE — Discharge Instructions (Signed)

## 2015-04-27 ENCOUNTER — Telehealth (HOSPITAL_COMMUNITY): Payer: Self-pay | Admitting: Obstetrics & Gynecology

## 2015-04-27 DIAGNOSIS — A749 Chlamydial infection, unspecified: Secondary | ICD-10-CM

## 2015-04-27 DIAGNOSIS — O98812 Other maternal infectious and parasitic diseases complicating pregnancy, second trimester: Principal | ICD-10-CM

## 2015-04-27 LAB — GC/CHLAMYDIA PROBE AMP (~~LOC~~) NOT AT ARMC
Chlamydia: POSITIVE — AB
NEISSERIA GONORRHEA: NEGATIVE

## 2015-04-27 LAB — HIV ANTIBODY (ROUTINE TESTING W REFLEX): HIV Screen 4th Generation wRfx: NONREACTIVE

## 2015-04-27 MED ORDER — AZITHROMYCIN 500 MG PO TABS: ORAL_TABLET | ORAL | Status: DC

## 2015-04-27 NOTE — Telephone Encounter (Signed)
Telephone call to patient regarding positive chlamydia culture, patient notified.  Rx called in per protocol.  Instructed patient to notify her partner for treatment and to abstain from sex for 7 days post treatment.  Report faxed to health department.

## 2015-04-28 ENCOUNTER — Telehealth: Payer: Self-pay | Admitting: Obstetrics and Gynecology

## 2015-04-28 NOTE — Telephone Encounter (Signed)
Pt called for lab results. pls call back

## 2015-04-28 NOTE — Telephone Encounter (Signed)
Per chart review patient was contacted about + chlamydia earlier today. Called patient stating I am returning her call. Patient states she was just wanting those results and wants to know if it will effect baby. Told patient no, as long as she is treated before delivery. Patient verbalized understanding and had no other questions

## 2015-05-22 ENCOUNTER — Other Ambulatory Visit: Payer: Self-pay | Admitting: Obstetrics

## 2015-05-22 ENCOUNTER — Ambulatory Visit (INDEPENDENT_AMBULATORY_CARE_PROVIDER_SITE_OTHER): Payer: Medicaid Other | Admitting: Obstetrics

## 2015-05-22 ENCOUNTER — Encounter: Payer: Self-pay | Admitting: Obstetrics

## 2015-05-22 ENCOUNTER — Ambulatory Visit (HOSPITAL_COMMUNITY)
Admission: RE | Admit: 2015-05-22 | Discharge: 2015-05-22 | Disposition: A | Discharge status: Home / Self Care | Payer: Medicaid Other | Source: Ambulatory Visit | Attending: Obstetrics | Admitting: Obstetrics

## 2015-05-22 VITALS — BP 113/75 | HR 74 | Temp 98.7°F | Ht 66.5 in | Wt 249.0 lb

## 2015-05-22 DIAGNOSIS — Z0489 Encounter for examination and observation for other specified reasons: Secondary | ICD-10-CM | POA: Insufficient documentation

## 2015-05-22 DIAGNOSIS — Z3482 Encounter for supervision of other normal pregnancy, second trimester: Secondary | ICD-10-CM

## 2015-05-22 DIAGNOSIS — O2690 Pregnancy related conditions, unspecified, unspecified trimester: Secondary | ICD-10-CM | POA: Diagnosis not present

## 2015-05-22 DIAGNOSIS — Z3A20 20 weeks gestation of pregnancy: Secondary | ICD-10-CM | POA: Insufficient documentation

## 2015-05-22 DIAGNOSIS — IMO0002 Reserved for concepts with insufficient information to code with codable children: Secondary | ICD-10-CM | POA: Insufficient documentation

## 2015-05-22 LAB — POCT URINALYSIS DIPSTICK
Bilirubin, UA: NEGATIVE
Blood, UA: NEGATIVE
GLUCOSE UA: NEGATIVE
Ketones, UA: NEGATIVE
LEUKOCYTES UA: NEGATIVE
NITRITE UA: NEGATIVE
PROTEIN UA: NEGATIVE
Spec Grav, UA: 1.015
UROBILINOGEN UA: NEGATIVE
pH, UA: 7

## 2015-05-22 MED ORDER — OB COMPLETE PETITE 35-5-1-200 MG PO CAPS: 1.0000 | ORAL_CAPSULE | Freq: Every day | ORAL | Status: DC

## 2015-05-22 NOTE — Progress Notes (Signed)
Subjective:    Dana Edwards is a 29 y.o. female being seen today for her obstetrical visit. She is at [redacted]w[redacted]d gestation. Patient reports: no complaints . Fetal movement: normal.  Problem List Items Addressed This Visit    None    Visit Diagnoses    Encounter for supervision of other normal pregnancy in second trimester    -  Primary    Relevant Medications    Prenat-FeCbn-FeAspGl-FA-Omega (OB COMPLETE PETITE) 35-5-1-200 MG CAPS    Other Relevant Orders    Obstetric panel    HIV antibody    Hemoglobinopathy evaluation    Varicella zoster antibody, IgG    Vit D  25 hydroxy (rtn osteoporosis monitoring)    Culture, OB Urine    POCT urinalysis dipstick (Completed)    SureSwab, Vaginosis/Vaginitis Plus    US OB Comp + 14 Wk      Patient Active Problem List   Diagnosis Date Noted  . [redacted] weeks gestation of pregnancy 04/26/2015  . Pregnancy related abdominal pain of lower quadrant, antepartum   . Encounter for fetal anatomic survey    Objective:    BP 113/75 mmHg  Pulse 74  Temp(Src) 98.7 F (37.1 C)  Ht 5' 6.5" (1.689 m)  Wt 249 lb (112.946 kg)  BMI 39.59 kg/m2  LMP 02/11/2015 (Approximate) FHT: 150 BPM  Uterine Size: obese     Assessment:    Pregnancy @ [redacted]w[redacted]d    Plan:    OBGCT: discussed.  Labs, problem list reviewed and updated 2 hr GTT planned Follow up in 4 weeks.

## 2015-05-23 ENCOUNTER — Encounter: Payer: Self-pay | Admitting: Obstetrics

## 2015-05-23 LAB — OBSTETRIC PANEL
ANTIBODY SCREEN: NEGATIVE
BASOS PCT: 0 % (ref 0–1)
Basophils Absolute: 0 10*3/uL (ref 0.0–0.1)
EOS PCT: 1 % (ref 0–5)
Eosinophils Absolute: 0.1 10*3/uL (ref 0.0–0.7)
HCT: 35.9 % — ABNORMAL LOW (ref 36.0–46.0)
HEMOGLOBIN: 11.5 g/dL — AB (ref 12.0–15.0)
Hepatitis B Surface Ag: NEGATIVE
LYMPHS ABS: 1.5 10*3/uL (ref 0.7–4.0)
Lymphocytes Relative: 19 % (ref 12–46)
MCH: 29.8 pg (ref 26.0–34.0)
MCHC: 32 g/dL (ref 30.0–36.0)
MCV: 93 fL (ref 78.0–100.0)
MPV: 8.7 fL (ref 8.6–12.4)
Monocytes Absolute: 0.7 10*3/uL (ref 0.1–1.0)
Monocytes Relative: 9 % (ref 3–12)
NEUTROS ABS: 5.6 10*3/uL (ref 1.7–7.7)
NEUTROS PCT: 71 % (ref 43–77)
Platelets: 280 10*3/uL (ref 150–400)
RBC: 3.86 MIL/uL — AB (ref 3.87–5.11)
RDW: 14.6 % (ref 11.5–15.5)
RH TYPE: POSITIVE
RUBELLA: 2.14 {index} — AB (ref <0.90)
WBC: 7.9 10*3/uL (ref 4.0–10.5)

## 2015-05-23 LAB — HIV ANTIBODY (ROUTINE TESTING W REFLEX): HIV 1&2 Ab, 4th Generation: NONREACTIVE

## 2015-05-23 LAB — VITAMIN D 25 HYDROXY (VIT D DEFICIENCY, FRACTURES): Vit D, 25-Hydroxy: 13 ng/mL — ABNORMAL LOW (ref 30–100)

## 2015-05-23 LAB — VARICELLA ZOSTER ANTIBODY, IGG: Varicella IgG: 2735 Index — ABNORMAL HIGH (ref <135.00)

## 2015-05-24 LAB — CULTURE, OB URINE

## 2015-05-24 LAB — HEMOGLOBINOPATHY EVALUATION
HEMOGLOBIN OTHER: 0 %
HGB A2 QUANT: 2.7 % (ref 2.2–3.2)
HGB A: 97.3 % (ref 96.8–97.8)
HGB S QUANTITAION: 0 %
Hgb F Quant: 0 % (ref 0.0–2.0)

## 2015-05-25 ENCOUNTER — Other Ambulatory Visit: Payer: Self-pay | Admitting: Obstetrics

## 2015-05-25 DIAGNOSIS — N760 Acute vaginitis: Principal | ICD-10-CM

## 2015-05-25 DIAGNOSIS — B373 Candidiasis of vulva and vagina: Secondary | ICD-10-CM

## 2015-05-25 DIAGNOSIS — B3731 Acute candidiasis of vulva and vagina: Secondary | ICD-10-CM

## 2015-05-25 DIAGNOSIS — B9689 Other specified bacterial agents as the cause of diseases classified elsewhere: Secondary | ICD-10-CM

## 2015-05-25 LAB — SURESWAB, VAGINOSIS/VAGINITIS PLUS
ATOPOBIUM VAGINAE: 5.1 Log (cells/mL)
C. ALBICANS, DNA: DETECTED — AB
C. TRACHOMATIS RNA, TMA: NOT DETECTED
C. glabrata, DNA: NOT DETECTED
C. parapsilosis, DNA: DETECTED — AB
C. tropicalis, DNA: NOT DETECTED
GARDNERELLA VAGINALIS: 6.8 Log (cells/mL)
LACTOBACILLUS SPECIES: NOT DETECTED Log (cells/mL)
MEGASPHAERA SPECIES: NOT DETECTED Log (cells/mL)
N. gonorrhoeae RNA, TMA: NOT DETECTED
T. VAGINALIS RNA, QL TMA: DETECTED — AB

## 2015-05-25 MED ORDER — FLUCONAZOLE 150 MG PO TABS: 150.0000 mg | ORAL_TABLET | Freq: Once | ORAL | Status: DC

## 2015-05-25 MED ORDER — TINIDAZOLE 500 MG PO TABS: 1000.0000 mg | ORAL_TABLET | Freq: Every day | ORAL | Status: DC

## 2015-05-29 ENCOUNTER — Encounter: Payer: Self-pay | Admitting: Obstetrics

## 2015-06-13 ENCOUNTER — Other Ambulatory Visit: Payer: Self-pay | Admitting: Certified Nurse Midwife

## 2015-06-19 ENCOUNTER — Ambulatory Visit (INDEPENDENT_AMBULATORY_CARE_PROVIDER_SITE_OTHER): Payer: Medicaid Other | Admitting: Obstetrics

## 2015-06-19 ENCOUNTER — Other Ambulatory Visit: Payer: Self-pay | Admitting: Obstetrics

## 2015-06-19 VITALS — BP 107/69 | HR 74 | Temp 97.9°F | Wt 253.0 lb

## 2015-06-19 DIAGNOSIS — Z0489 Encounter for examination and observation for other specified reasons: Secondary | ICD-10-CM

## 2015-06-19 DIAGNOSIS — Z3492 Encounter for supervision of normal pregnancy, unspecified, second trimester: Secondary | ICD-10-CM | POA: Diagnosis not present

## 2015-06-19 DIAGNOSIS — IMO0002 Reserved for concepts with insufficient information to code with codable children: Secondary | ICD-10-CM

## 2015-06-19 DIAGNOSIS — Z3689 Encounter for other specified antenatal screening: Secondary | ICD-10-CM

## 2015-06-19 DIAGNOSIS — Z36 Encounter for antenatal screening of mother: Secondary | ICD-10-CM

## 2015-06-19 LAB — POCT URINALYSIS DIPSTICK
Blood, UA: NEGATIVE
Glucose, UA: NEGATIVE
KETONES UA: NEGATIVE
Leukocytes, UA: NEGATIVE
Nitrite, UA: NEGATIVE
PH UA: 6
Protein, UA: NEGATIVE
Spec Grav, UA: 1.01

## 2015-06-19 NOTE — Progress Notes (Signed)
Patient reports she is doing well- patient has questions about when she needs TOC

## 2015-06-20 ENCOUNTER — Encounter: Payer: Self-pay | Admitting: Obstetrics

## 2015-06-20 NOTE — Progress Notes (Signed)
Subjective:    Dana Edwards is a 29 y.o. female being seen today for her obstetrical visit. She is at 5179w2d gestation. Patient reports: no complaints . Fetal movement: normal.  Problem List Items Addressed This Visit    Encounter for fetal anatomic survey    Other Visit Diagnoses    Prenatal care, second trimester    -  Primary    Relevant Orders    POCT urinalysis dipstick (Completed)    SureSwab, Vaginosis/Vaginitis Plus      Patient Active Problem List   Diagnosis Date Noted  . Evaluate anatomy not seen on prior sonogram   . [redacted] weeks gestation of pregnancy   . [redacted] weeks gestation of pregnancy 04/26/2015  . Pregnancy related abdominal pain of lower quadrant, antepartum   . Encounter for fetal anatomic survey    Objective:    BP 107/69 mmHg  Pulse 74  Temp(Src) 97.9 F (36.6 C)  Wt 253 lb (114.76 kg)  LMP 02/11/2015 (Approximate) FHT: 150 BPM  Uterine Size: size equals dates     Assessment:    Pregnancy @ 5379w2d    Plan:    OBGCT: ordered for next visit.  Labs, problem list reviewed and updated 2 hr GTT planned Follow up in 4 weeks.

## 2015-06-22 LAB — SURESWAB, VAGINOSIS/VAGINITIS PLUS
Atopobium vaginae: NOT DETECTED Log (cells/mL)
C. GLABRATA, DNA: NOT DETECTED
C. TROPICALIS, DNA: NOT DETECTED
C. albicans, DNA: DETECTED — AB
C. parapsilosis, DNA: NOT DETECTED
C. trachomatis RNA, TMA: NOT DETECTED
Gardnerella vaginalis: NOT DETECTED Log (cells/mL)
LACTOBACILLUS SPECIES: NOT DETECTED Log (cells/mL)
MEGASPHAERA SPECIES: NOT DETECTED Log (cells/mL)
N. gonorrhoeae RNA, TMA: NOT DETECTED
T. vaginalis RNA, QL TMA: NOT DETECTED

## 2015-06-23 ENCOUNTER — Telehealth: Payer: Self-pay | Admitting: *Deleted

## 2015-06-23 ENCOUNTER — Other Ambulatory Visit: Payer: Self-pay | Admitting: Obstetrics

## 2015-06-23 DIAGNOSIS — B373 Candidiasis of vulva and vagina: Secondary | ICD-10-CM

## 2015-06-23 DIAGNOSIS — B3731 Acute candidiasis of vulva and vagina: Secondary | ICD-10-CM

## 2015-06-23 MED ORDER — FLUCONAZOLE 150 MG PO TABS: 150.0000 mg | ORAL_TABLET | Freq: Once | ORAL | Status: DC

## 2015-06-23 NOTE — Telephone Encounter (Signed)
Patient calling for lab results- all clear except for yeast- patient has refills at the pharmacy and will take care of that.

## 2015-06-26 ENCOUNTER — Ambulatory Visit (HOSPITAL_COMMUNITY): Payer: Medicaid Other | Attending: Obstetrics

## 2015-07-07 ENCOUNTER — Other Ambulatory Visit: Payer: Self-pay | Admitting: Certified Nurse Midwife

## 2015-07-11 ENCOUNTER — Ambulatory Visit (HOSPITAL_COMMUNITY)
Admission: RE | Admit: 2015-07-11 | Discharge: 2015-07-11 | Disposition: A | Discharge status: Home / Self Care | Payer: Medicaid Other | Source: Ambulatory Visit | Attending: Obstetrics | Admitting: Obstetrics

## 2015-07-11 ENCOUNTER — Other Ambulatory Visit: Payer: Self-pay | Admitting: Obstetrics

## 2015-07-11 DIAGNOSIS — Z36 Encounter for antenatal screening of mother: Secondary | ICD-10-CM | POA: Insufficient documentation

## 2015-07-11 DIAGNOSIS — O26872 Cervical shortening, second trimester: Secondary | ICD-10-CM

## 2015-07-11 DIAGNOSIS — Z0489 Encounter for examination and observation for other specified reasons: Secondary | ICD-10-CM

## 2015-07-11 DIAGNOSIS — IMO0002 Reserved for concepts with insufficient information to code with codable children: Secondary | ICD-10-CM

## 2015-07-11 MED ORDER — BETAMETHASONE SOD PHOS & ACET 6 (3-3) MG/ML IJ SUSP
12.0000 mg | Freq: Once | INTRAMUSCULAR | Status: AC
  Administered 2015-07-11: 12 mg via INTRAMUSCULAR
  Filled 2015-07-11: qty 2

## 2015-07-12 ENCOUNTER — Ambulatory Visit (HOSPITAL_COMMUNITY)
Admission: RE | Admit: 2015-07-12 | Discharge: 2015-07-12 | Disposition: A | Discharge status: Home / Self Care | Payer: Medicaid Other | Source: Ambulatory Visit | Attending: Obstetrics | Admitting: Obstetrics

## 2015-07-12 DIAGNOSIS — O26872 Cervical shortening, second trimester: Secondary | ICD-10-CM | POA: Insufficient documentation

## 2015-07-12 DIAGNOSIS — Z3A01 Less than 8 weeks gestation of pregnancy: Secondary | ICD-10-CM | POA: Insufficient documentation

## 2015-07-12 MED ORDER — BETAMETHASONE SOD PHOS & ACET 6 (3-3) MG/ML IJ SUSP
12.0000 mg | Freq: Once | INTRAMUSCULAR | Status: AC
  Administered 2015-07-12: 12 mg via INTRAMUSCULAR
  Filled 2015-07-12: qty 2

## 2015-07-17 ENCOUNTER — Encounter: Payer: Self-pay | Admitting: Obstetrics

## 2015-07-17 ENCOUNTER — Ambulatory Visit (INDEPENDENT_AMBULATORY_CARE_PROVIDER_SITE_OTHER): Payer: Medicaid Other | Admitting: Obstetrics

## 2015-07-17 ENCOUNTER — Other Ambulatory Visit: Payer: Medicaid Other

## 2015-07-17 VITALS — BP 119/76 | HR 95 | Wt 251.0 lb

## 2015-07-17 DIAGNOSIS — Z3483 Encounter for supervision of other normal pregnancy, third trimester: Secondary | ICD-10-CM

## 2015-07-17 LAB — CBC
HEMATOCRIT: 34.4 % — AB (ref 36.0–46.0)
HEMOGLOBIN: 11.5 g/dL — AB (ref 12.0–15.0)
MCH: 30.3 pg (ref 26.0–34.0)
MCHC: 33.4 g/dL (ref 30.0–36.0)
MCV: 90.8 fL (ref 78.0–100.0)
MPV: 8.4 fL — AB (ref 8.6–12.4)
Platelets: 277 10*3/uL (ref 150–400)
RBC: 3.79 MIL/uL — AB (ref 3.87–5.11)
RDW: 14.3 % (ref 11.5–15.5)
WBC: 8.4 10*3/uL (ref 4.0–10.5)

## 2015-07-17 LAB — POCT URINALYSIS DIPSTICK
Bilirubin, UA: NEGATIVE
Blood, UA: NEGATIVE
Glucose, UA: NEGATIVE
KETONES UA: NEGATIVE
LEUKOCYTES UA: NEGATIVE
NITRITE UA: NEGATIVE
PROTEIN UA: NEGATIVE
Spec Grav, UA: 1.01
UROBILINOGEN UA: NEGATIVE
pH, UA: 6.5

## 2015-07-17 NOTE — Progress Notes (Signed)
Subjective:    Dana Edwards is a 29 y.o. female being seen today for her obstetrical visit. She is at [redacted]w[redacted]d gestation. Patient reports no complaints. Fetal movement: normal.  Problem List Items Addressed This Visit    None    Visit Diagnoses    Encounter for supervision of other normal pregnancy in second trimester    -  Primary    Relevant Orders    POCT urinalysis dipstick (Completed)    Glucose Tolerance, 2 Hours w/1 Hour    CBC    HIV antibody    RPR      Patient Active Problem List   Diagnosis Date Noted  . Evaluate anatomy not seen on prior sonogram   . [redacted] weeks gestation of pregnancy   . [redacted] weeks gestation of pregnancy 04/26/2015  . Pregnancy related abdominal pain of lower quadrant, antepartum   . Encounter for fetal anatomic survey    Objective:    BP 119/76 mmHg  Pulse 95  Wt 251 lb (113.853 kg)  LMP 02/11/2015 (Approximate) FHT:  150 BPM  Uterine Size: size greater than dates  Presentation: unsure     Assessment:    Pregnancy @ [redacted]w[redacted]d weeks   Plan:     labs reviewed, problem list updated Consent signed. GBS sent TDAP offered  Rhogam given for RH negative Pediatrician: discussed. Infant feeding: plans to breastfeed. Maternity leave: discussed. Cigarette smoking: never smoked. Orders Placed This Encounter  Procedures  . Glucose Tolerance, 2 Hours w/1 Hour  . CBC  . HIV antibody  . RPR  . POCT urinalysis dipstick   No orders of the defined types were placed in this encounter.   Follow up in 2 Weeks.

## 2015-07-18 LAB — GLUCOSE TOLERANCE, 2 HOURS W/ 1HR
GLUCOSE, 2 HOUR: 62 mg/dL — AB (ref 70–139)
GLUCOSE: 117 mg/dL (ref 70–170)
Glucose, Fasting: 59 mg/dL — ABNORMAL LOW (ref 65–99)

## 2015-07-18 LAB — HIV ANTIBODY (ROUTINE TESTING W REFLEX): HIV 1&2 Ab, 4th Generation: NONREACTIVE

## 2015-07-18 LAB — SYPHILIS: RPR W/REFLEX TO RPR TITER AND TREPONEMAL ANTIBODIES, TRADITIONAL SCREENING AND DIAGNOSIS ALGORITHM

## 2015-07-26 ENCOUNTER — Telehealth: Payer: Self-pay | Admitting: *Deleted

## 2015-07-26 NOTE — Telephone Encounter (Signed)
Patient contacted the office requesting her 2 hour glucose lab results. Labs reviewed with patient and patient verbalized understanding.

## 2015-07-31 ENCOUNTER — Encounter: Payer: Medicaid Other | Admitting: Obstetrics

## 2015-08-01 ENCOUNTER — Ambulatory Visit (INDEPENDENT_AMBULATORY_CARE_PROVIDER_SITE_OTHER): Payer: Medicaid Other | Admitting: Obstetrics

## 2015-08-01 ENCOUNTER — Encounter: Payer: Self-pay | Admitting: Obstetrics

## 2015-08-01 VITALS — BP 120/76 | HR 73 | Temp 97.7°F | Wt 255.1 lb

## 2015-08-01 DIAGNOSIS — Z3483 Encounter for supervision of other normal pregnancy, third trimester: Secondary | ICD-10-CM

## 2015-08-01 NOTE — Progress Notes (Signed)
Subjective:    Dana Edwards is a 29 y.o. female being seen today for her obstetrical visit. She is at [redacted]w[redacted]d gestation. Patient reports no complaints. Fetal movement: normal.  Problem List Items Addressed This Visit    None     Patient Active Problem List   Diagnosis Date Noted  . Evaluate anatomy not seen on prior sonogram   . [redacted] weeks gestation of pregnancy   . [redacted] weeks gestation of pregnancy 04/26/2015  . Pregnancy related abdominal pain of lower quadrant, antepartum   . Encounter for fetal anatomic survey    Objective:    BP 120/76 mmHg  Pulse 73  Temp(Src) 97.7 F (36.5 C)  Wt 255 lb 1.6 oz (115.713 kg)  LMP 02/11/2015 (Approximate) FHT:  150 BPM  Uterine Size: size equals dates  Presentation: unsure     Assessment:    Pregnancy @ [redacted]w[redacted]d weeks   Plan:     labs reviewed, problem list updated Consent signed. GBS sent TDAP offered  Rhogam given for RH negative Pediatrician: discussed. Infant feeding: plans to breastfeed. Maternity leave: discussed. Cigarette smoking: never smoked. No orders of the defined types were placed in this encounter.   No orders of the defined types were placed in this encounter.   Follow up in 3 weeks.

## 2015-08-18 ENCOUNTER — Inpatient Hospital Stay (HOSPITAL_COMMUNITY)
Admission: AD | Admit: 2015-08-18 | Discharge: 2015-08-18 | Disposition: A | Discharge status: Home / Self Care | Payer: Medicaid Other | Source: Ambulatory Visit | Attending: Obstetrics | Admitting: Obstetrics

## 2015-08-18 DIAGNOSIS — O26893 Other specified pregnancy related conditions, third trimester: Secondary | ICD-10-CM | POA: Insufficient documentation

## 2015-08-18 DIAGNOSIS — O9989 Other specified diseases and conditions complicating pregnancy, childbirth and the puerperium: Secondary | ICD-10-CM | POA: Diagnosis not present

## 2015-08-18 DIAGNOSIS — R109 Unspecified abdominal pain: Secondary | ICD-10-CM | POA: Diagnosis not present

## 2015-08-18 DIAGNOSIS — N949 Unspecified condition associated with female genital organs and menstrual cycle: Secondary | ICD-10-CM

## 2015-08-18 DIAGNOSIS — R102 Pelvic and perineal pain: Secondary | ICD-10-CM | POA: Insufficient documentation

## 2015-08-18 DIAGNOSIS — Z3A32 32 weeks gestation of pregnancy: Secondary | ICD-10-CM | POA: Insufficient documentation

## 2015-08-18 LAB — URINALYSIS, ROUTINE W REFLEX MICROSCOPIC
Bilirubin Urine: NEGATIVE
Glucose, UA: NEGATIVE mg/dL
Hgb urine dipstick: NEGATIVE
Ketones, ur: NEGATIVE mg/dL
Leukocytes, UA: NEGATIVE
Nitrite: NEGATIVE
PROTEIN: NEGATIVE mg/dL
Specific Gravity, Urine: 1.01 (ref 1.005–1.030)
UROBILINOGEN UA: 0.2 mg/dL (ref 0.0–1.0)
pH: 6 (ref 5.0–8.0)

## 2015-08-18 NOTE — MAU Note (Signed)
Pt reports increased pelvic pressure that started today. Good fetal movement. Denies Vag discharge or bleeding.

## 2015-08-18 NOTE — Discharge Instructions (Signed)
Round Ligament Pain During Pregnancy   Round ligament pain is a sharp pain or jabbing feeling often felt in the lower belly or groin area on one or both sides. It is one of the most common complaints during pregnancy and is considered a normal part of pregnancy. It is most often felt during the second trimester.   Here is what you need to know about round ligament pain, including some tips to help you feel better.   Causes of Round Ligament Pain   Several thick ligaments surround and support your womb (uterus) as it grows during pregnancy. One of them is called the round ligament.   The round ligament connects the front part of the womb to your groin, the area where your legs attach to your pelvis. The round ligament normally tightens and relaxes slowly.   As your baby and womb grow, the round ligament stretches. That makes it more likely to become strained.   Sudden movements can cause the ligament to tighten quickly, like a rubber band snapping. This causes a sudden and quick jabbing feeling.   Symptoms of Round Ligament Pain   Round ligament pain can be concerning and uncomfortable. But it is considered normal as your body changes during pregnancy.   The symptoms of round ligament pain include a sharp, sudden spasm in the belly. It usually affects the right side, but it may happen on both sides. The pain only lasts a few seconds.   Exercise may cause the pain, as will rapid movements such as:  sneezing  coughing  laughing  rolling over in bed  standing up too quickly   Treatment of Round Ligament Pain   Here are some tips that may help reduce your discomfort:   Pain relief. Take over-the-counter acetaminophen for pain, if necessary. Ask your doctor if this is OK.   Exercise. Get plenty of exercise to keep your stomach (core) muscles strong. Doing stretching exercises or prenatal yoga can be helpful. Ask your doctor which exercises are safe for you and your baby.   A helpful  exercise involves putting your hands and knees on the floor, lowering your head, and pushing your backside into the air.   Avoid sudden movements. Change positions slowly (such as standing up or sitting down) to avoid sudden movements that may cause stretching and pain.   Flex your hips. Bend and flex your hips before you cough, sneeze, or laugh to avoid pulling on the ligaments.   Apply warmth. A heating pad or warm bath may be helpful. Ask your doctor if this is OK. Extreme heat can be dangerous to the baby.   You should try to modify your daily activity level and avoid positions that may worsen the condition.   When to Call the Doctor/Midwife   Always tell your doctor or midwife about any type of pain you have during pregnancy. Round ligament pain is quick and doesn't last long.   Call your health care provider immediately if you have:  severe pain  fever  chills  pain on urination  difficulty walking   Belly pain during pregnancy can be due to many different causes. It is important for your doctor to rule out more serious conditions, including pregnancy complications such as placenta abruption or non-pregnancy illnesses such as:  inguinal hernia  appendicitis  stomach, liver, and kidney problems  Preterm labor pains may sometimes be mistaken for round ligament pain.    May take Tylenol as directed on bottle

## 2015-08-18 NOTE — MAU Provider Note (Signed)
History     CSN: [id number]  Arrival date and time: 08/18/15 1341   First Provider Initiated Contact with Patient 08/18/15 1507      Chief Complaint  Patient presents with  . Pelvic Pain   HPI Comments: Dana Edwards is a 29 y.o. G3P2002 at [redacted]w[redacted]d Presenting with 3 day history of suprapubic crampy abdominal pain, especially when in sitting position. Has difficulty getting comfortable and pain is also present with walking and moving. Spends most of the day lying and resting, walks a lot at night. Mother concerned she had similar sx's at time of emergency C/S with P1.   Pelvic Pain The patient's primary symptoms include pelvic pain. Associated symptoms include abdominal pain and back pain. Pertinent negatives include no anorexia, constipation, dysuria, fever, flank pain, frequency, headaches, hematuria, nausea, urgency or vomiting. Her past medical history is significant for an abdominal surgery. (Hx C/S)  Abdominal Pain This is a new problem. The current episode started in the past 7 days. The onset quality is gradual. The problem occurs 2 to 4 times per day. The problem has been unchanged. The pain is located in the suprapubic region. The pain is at a severity of 4/10. The pain is moderate. The abdominal pain does not radiate. Pertinent negatives include no anorexia, constipation, dysuria, fever, frequency, headaches, hematuria, myalgias, nausea or vomiting. The pain is aggravated by certain positions and movement. The pain is relieved by recumbency. She has tried nothing for the symptoms. Her past medical history is significant for abdominal surgery. There is no history of irritable bowel syndrome. hx C/S   OB History  Gravida Para Term Preterm AB SAB TAB Ectopic Multiple Living  0 0 0 0 0 0 2    # Outcome Date GA Lbr Len/2nd Weight Sex Delivery Anes PTL Lv  3 Current           2 Term 12/17/07 [redacted]w[redacted]d  3.175 kg (7 lb) M Vag-Spont EPI N Y  1 Term 10/26/06 [redacted]w[redacted]d  3.317 kg (7 lb 5 oz)  F CS-LTranv EPI N Y     Complications: Breech presentation       Past Medical History  Diagnosis Date  . Medical history non-contributory     Past Surgical History  Procedure Laterality Date  . Cesarean section      No family history on file.  Social History  Substance Use Topics  . Smoking status: Never Smoker   . Smokeless tobacco: Never Used  . Alcohol Use: No    Allergies: No Known Allergies  Prescriptions prior to admission  Medication Sig Dispense Refill Last Dose  . Prenat-FeCbn-FeAspGl-FA-Omega (OB COMPLETE PETITE) 35-5-1-200 MG CAPS Take 1 capsule by mouth daily before breakfast. 90 capsule 3 08/18/2015 at Unknown time  . fluconazole (DIFLUCAN) 150 MG tablet Take 1 tablet (150 mg total) by mouth once. (Patient not taking: Reported on 08/01/2015) 1 tablet 2 Not Taking at Unknown time  . tinidazole (TINDAMAX) 500 MG tablet Take 2 tablets (1,000 mg total) by mouth daily with breakfast. (Patient not taking: Reported on 06/19/2015) 10 tablet 2 Not Taking at Unknown time    Review of Systems  Constitutional: Negative for fever.  Respiratory: Negative for cough.   Gastrointestinal: Positive for abdominal pain. Negative for nausea, vomiting, constipation and anorexia.  Genitourinary: Positive for pelvic pain. Negative for dysuria, urgency, frequency, hematuria and flank pain.  Musculoskeletal: Positive for back pain. Negative for myalgias.  Neurological: Negative for dizziness and headaches.  Psychiatric/Behavioral: Negative for depression. The patient has insomnia. The patient is not nervous/anxious.    Physical Exam   Blood pressure 122/68, pulse 99, resp. rate 18, height 5' 5.5" (1.664 m), weight 117.391 kg (258 lb 12.8 oz), last menstrual period 02/11/2015.  Physical Exam  Nursing note and vitals reviewed. Constitutional: She is oriented to person, place, and time. No distress.  obese  HENT:  Head: Normocephalic.  Eyes: Pupils are equal, round, and reactive to  light.  Neck: Normal range of motion.  Cardiovascular: Normal rate and regular rhythm.   Respiratory: Effort normal.  GI: She exhibits distension. There is no tenderness. There is no rebound and no guarding.  Musculoskeletal: Normal range of motion.  Neurological: She is alert and oriented to person, place, and time.    MAU Course  Procedures EFM: Baseline 145-150, moderate variability, accelerations present, no decelerations, Toco: No contractions  Results for orders placed or performed during the hospital encounter of 08/18/15 (from the past 24 hour(s))  Urinalysis, Routine w reflex microscopic (not at [facility])     Status: None   Collection Time: 08/18/15  2:25 PM  Result Value Ref Range   Color, Urine YELLOW YELLOW   APPearance CLEAR CLEAR   Specific Gravity, Urine 1.010 1.005 - 1.030   pH 6.0 5.0 - 8.0   Glucose, UA NEGATIVE NEGATIVE mg/dL   Hgb urine dipstick NEGATIVE NEGATIVE   Bilirubin Urine NEGATIVE NEGATIVE   Ketones, ur NEGATIVE NEGATIVE mg/dL   Protein, ur NEGATIVE NEGATIVE mg/dL   Urobilinogen, UA 0.2 0.0 - 1.0 mg/dL   Nitrite NEGATIVE NEGATIVE   Leukocytes, UA NEGATIVE NEGATIVE   MDM Obese multip without evident UTI or PTL, 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 Round ligament pain   FHR reactive  Discharge home with reassurance   Medication List    TAKE these medications        OB COMPLETE PETITE 35-5-1-200 MG Caps  Take 1 capsule by mouth daily before breakfast.      ASK your doctor about these medications        fluconazole 150 MG tablet  Commonly known as:  DIFLUCAN  Take 1 tablet (150 mg total) by mouth once.     tinidazole 500 MG tablet  Commonly known as:  TINDAMAX  Take 2 tablets (1,000 mg total) by mouth daily with breakfast.       Follow-up Information    Follow up with HARPER,CHARLES A, MD On 08/31/2015.   Specialty:  Obstetrics and Gynecology   Contact information:   [address] [place] [address] [phone]       Foye Haggart 08/18/2015, 3:08 PM

## 2015-08-22 ENCOUNTER — Encounter: Payer: Medicaid Other | Admitting: Obstetrics

## 2015-08-24 ENCOUNTER — Ambulatory Visit (INDEPENDENT_AMBULATORY_CARE_PROVIDER_SITE_OTHER): Payer: Medicaid Other | Admitting: Obstetrics

## 2015-08-24 ENCOUNTER — Encounter: Payer: Self-pay | Admitting: Obstetrics

## 2015-08-24 VITALS — BP 124/74 | HR 91 | Temp 98.2°F | Wt 258.0 lb

## 2015-08-24 DIAGNOSIS — Z3483 Encounter for supervision of other normal pregnancy, third trimester: Secondary | ICD-10-CM

## 2015-08-24 LAB — POCT URINALYSIS DIPSTICK
BILIRUBIN UA: NEGATIVE
Blood, UA: NEGATIVE
Glucose, UA: NEGATIVE
Ketones, UA: NEGATIVE
LEUKOCYTES UA: NEGATIVE
Nitrite, UA: NEGATIVE
PH UA: 6
PROTEIN UA: NEGATIVE
SPEC GRAV UA: 1.015
Urobilinogen, UA: NEGATIVE

## 2015-08-24 NOTE — Progress Notes (Signed)
Subjective:    Dana Edwards is a 29 y.o. female being seen today for her obstetrical visit. She is at [redacted]w[redacted]d gestation. Patient reports occasional contractions and pressure. Fetal movement: normal.  Problem List Items Addressed This Visit    None    Visit Diagnoses    Encounter for supervision of other normal pregnancy in third trimester    -  Primary    Relevant Orders    POCT urinalysis dipstick (Completed)      Patient Active Problem List   Diagnosis Date Noted  . Evaluate anatomy not seen on prior sonogram   . [redacted] weeks gestation of pregnancy   . [redacted] weeks gestation of pregnancy 04/26/2015  . Pregnancy related abdominal pain of lower quadrant, antepartum   . Encounter for fetal anatomic survey    Objective:    BP 124/74 mmHg  Pulse 91  Temp(Src) 98.2 F (36.8 C)  Wt 258 lb (117.028 kg)  LMP 02/11/2015 (Approximate) FHT:  150 BPM  Uterine Size: size greater than dates  Presentation: unsure     Assessment:    Pregnancy @ [redacted]w[redacted]d weeks   Plan:     labs reviewed, problem list updated Consent signed. GBS sent TDAP offered  Rhogam given for RH negative Pediatrician: discussed. Infant feeding: plans to breastfeed. Maternity leave: discussed. Cigarette smoking: never smoked. Orders Placed This Encounter  Procedures  . POCT urinalysis dipstick   No orders of the defined types were placed in this encounter.   Follow up in 1 Week.

## 2015-08-31 ENCOUNTER — Ambulatory Visit (INDEPENDENT_AMBULATORY_CARE_PROVIDER_SITE_OTHER): Payer: Medicaid Other | Admitting: Obstetrics

## 2015-08-31 ENCOUNTER — Encounter: Payer: Self-pay | Admitting: Obstetrics

## 2015-08-31 VITALS — BP 121/76 | HR 90 | Temp 98.7°F | Wt 260.0 lb

## 2015-08-31 DIAGNOSIS — O0943 Supervision of pregnancy with grand multiparity, third trimester: Secondary | ICD-10-CM

## 2015-08-31 NOTE — Progress Notes (Signed)
Patient reports she is doing well today. 

## 2015-08-31 NOTE — Progress Notes (Signed)
Subjective:    Dana Edwards is a 29 y.o. female being seen today for her obstetrical visit. She is at [redacted]w[redacted]d gestation. Patient reports no complaints. Fetal movement: normal.  Problem List Items Addressed This Visit    None    Visit Diagnoses    Supervision of pregnancy with grand multiparity in third trimester    -  Primary      Patient Active Problem List   Diagnosis Date Noted  . Evaluate anatomy not seen on prior sonogram   . [redacted] weeks gestation of pregnancy   . [redacted] weeks gestation of pregnancy 04/26/2015  . Pregnancy related abdominal pain of lower quadrant, antepartum   . Encounter for fetal anatomic survey    Objective:    BP 121/76 mmHg  Pulse 90  Temp(Src) 98.7 F (37.1 C)  Wt 260 lb (117.935 kg)  LMP 02/11/2015 (Approximate) FHT:  150 BPM  Uterine Size: size equals dates  Presentation: unsure     Assessment:    Pregnancy @ [redacted]w[redacted]d weeks   Plan:     labs reviewed, problem list updated Consent signed. GBS sent TDAP offered  Rhogam given for RH negative Pediatrician: discussed. Infant feeding: plans to breastfeed. Maternity leave: discussed. Cigarette smoking: never smoked. No orders of the defined types were placed in this encounter.   No orders of the defined types were placed in this encounter.   Follow up in 1 Week.

## 2015-09-06 ENCOUNTER — Encounter: Payer: Self-pay | Admitting: Obstetrics

## 2015-09-06 ENCOUNTER — Ambulatory Visit (INDEPENDENT_AMBULATORY_CARE_PROVIDER_SITE_OTHER): Payer: Medicaid Other | Admitting: Obstetrics

## 2015-09-06 VITALS — BP 111/71 | HR 88 | Temp 97.3°F | Wt 261.0 lb

## 2015-09-06 DIAGNOSIS — Z3483 Encounter for supervision of other normal pregnancy, third trimester: Secondary | ICD-10-CM

## 2015-09-06 LAB — POCT URINALYSIS DIPSTICK
BILIRUBIN UA: NEGATIVE
GLUCOSE UA: NEGATIVE
KETONES UA: NEGATIVE
Leukocytes, UA: NEGATIVE
Nitrite, UA: NEGATIVE
Protein, UA: NEGATIVE
RBC UA: NEGATIVE
SPEC GRAV UA: 1.015
Urobilinogen, UA: NEGATIVE
pH, UA: 5.5

## 2015-09-06 NOTE — Progress Notes (Signed)
Subjective:    Dana Edwards is a 29 y.o. female being seen today for her obstetrical visit. She is at [redacted]w[redacted]d gestation. Patient reports no complaints. Fetal movement: normal.  Problem List Items Addressed This Visit    None    Visit Diagnoses    Encounter for supervision of other normal pregnancy in third trimester    -  Primary    Relevant Orders    POCT urinalysis dipstick (Completed)    Strep B DNA probe      Patient Active Problem List   Diagnosis Date Noted  . Evaluate anatomy not seen on prior sonogram   . [redacted] weeks gestation of pregnancy   . [redacted] weeks gestation of pregnancy 04/26/2015  . Pregnancy related abdominal pain of lower quadrant, antepartum   . Encounter for fetal anatomic survey    Objective:    BP 111/71 mmHg  Pulse 88  Temp(Src) 97.3 F (36.3 C)  Wt 261 lb (118.389 kg)  LMP 02/11/2015 (Approximate) FHT:  150 BPM  Uterine Size: size equals dates  Presentation: unsure     Assessment:    Pregnancy @ [redacted]w[redacted]d weeks   Plan:     labs reviewed, problem list updated Consent signed. GBS sent TDAP offered  Rhogam given for RH negative Pediatrician: discussed. Infant feeding: plans to breastfeed. Maternity leave: discussed. Cigarette smoking: never smoked. Orders Placed This Encounter  Procedures  . Strep B DNA probe  . POCT urinalysis dipstick   No orders of the defined types were placed in this encounter.   Follow up in 1 Week.

## 2015-09-08 LAB — STREP B DNA PROBE: STREP GROUP B AG: NOT DETECTED

## 2015-09-14 ENCOUNTER — Encounter: Payer: Self-pay | Admitting: Obstetrics

## 2015-09-14 ENCOUNTER — Ambulatory Visit (INDEPENDENT_AMBULATORY_CARE_PROVIDER_SITE_OTHER): Payer: Medicaid Other | Admitting: Obstetrics

## 2015-09-14 DIAGNOSIS — O0993 Supervision of high risk pregnancy, unspecified, third trimester: Secondary | ICD-10-CM

## 2015-09-14 LAB — POCT URINALYSIS DIPSTICK
BILIRUBIN UA: NEGATIVE
Blood, UA: NEGATIVE
Glucose, UA: NEGATIVE
Ketones, UA: NEGATIVE
Leukocytes, UA: NEGATIVE
NITRITE UA: NEGATIVE
Protein, UA: NEGATIVE
Spec Grav, UA: 1.01
Urobilinogen, UA: NEGATIVE
pH, UA: 7

## 2015-09-14 NOTE — Progress Notes (Signed)
Subjective:    Dana Edwards is a 29 y.o. female being seen today for her obstetrical visit. She is at [redacted]w[redacted]d gestation. Patient reports no complaints. Fetal movement: normal.  Problem List Items Addressed This Visit    None    Visit Diagnoses    Supervision of high risk pregnancy in third trimester        Relevant Orders    POCT urinalysis dipstick      Patient Active Problem List   Diagnosis Date Noted  . Evaluate anatomy not seen on prior sonogram   . [redacted] weeks gestation of pregnancy   . [redacted] weeks gestation of pregnancy 04/26/2015  . Pregnancy related abdominal pain of lower quadrant, antepartum   . Encounter for fetal anatomic survey    Objective:    BP 124/77 mmHg  Pulse 101  Temp(Src) 98.8 F (37.1 C)  Wt 262 lb (118.842 kg)  LMP 02/11/2015 (Approximate) FHT:  150 BPM  Uterine Size: size greater than dates  Presentation: unsure     Assessment:    Pregnancy @ [redacted]w[redacted]d weeks   Plan:     labs reviewed, problem list updated Consent signed. GBS sent TDAP offered  Rhogam given for RH negative Pediatrician: discussed. Infant feeding: plans to breastfeed. Maternity leave: discussed. Cigarette smoking: never smoked. Orders Placed This Encounter  Procedures  . POCT urinalysis dipstick   No orders of the defined types were placed in this encounter.   Follow up in 1 Week.

## 2015-09-21 ENCOUNTER — Encounter: Payer: Medicaid Other | Admitting: Obstetrics

## 2015-09-21 ENCOUNTER — Inpatient Hospital Stay (HOSPITAL_COMMUNITY)
Admission: AD | Admit: 2015-09-21 | Discharge: 2015-09-23 | DRG: 775 | Disposition: A | Discharge status: Home / Self Care | Payer: Medicaid Other | Source: Ambulatory Visit | Attending: Obstetrics | Admitting: Obstetrics

## 2015-09-21 ENCOUNTER — Encounter (HOSPITAL_COMMUNITY): Payer: Self-pay

## 2015-09-21 ENCOUNTER — Inpatient Hospital Stay (HOSPITAL_COMMUNITY): Payer: Medicaid Other | Admitting: Anesthesiology

## 2015-09-21 DIAGNOSIS — Z6841 Body Mass Index (BMI) 40.0 and over, adult: Secondary | ICD-10-CM | POA: Diagnosis not present

## 2015-09-21 DIAGNOSIS — O34219 Maternal care for unspecified type scar from previous cesarean delivery: Secondary | ICD-10-CM | POA: Diagnosis present

## 2015-09-21 DIAGNOSIS — Z3A37 37 weeks gestation of pregnancy: Secondary | ICD-10-CM

## 2015-09-21 DIAGNOSIS — O99214 Obesity complicating childbirth: Secondary | ICD-10-CM | POA: Diagnosis present

## 2015-09-21 DIAGNOSIS — IMO0001 Reserved for inherently not codable concepts without codable children: Secondary | ICD-10-CM

## 2015-09-21 LAB — CBC
HCT: 33.5 % — ABNORMAL LOW (ref 36.0–46.0)
HEMOGLOBIN: 11.3 g/dL — AB (ref 12.0–15.0)
MCH: 30 pg (ref 26.0–34.0)
MCHC: 33.7 g/dL (ref 30.0–36.0)
MCV: 88.9 fL (ref 78.0–100.0)
PLATELETS: 246 10*3/uL (ref 150–400)
RBC: 3.77 MIL/uL — AB (ref 3.87–5.11)
RDW: 14.2 % (ref 11.5–15.5)
WBC: 10.4 10*3/uL (ref 4.0–10.5)

## 2015-09-21 LAB — TYPE AND SCREEN
ABO/RH(D): O POS
ANTIBODY SCREEN: NEGATIVE

## 2015-09-21 LAB — RPR: RPR: NONREACTIVE

## 2015-09-21 MED ORDER — MISOPROSTOL 200 MCG PO TABS
ORAL_TABLET | ORAL | Status: AC
  Administered 2015-09-21: 1000 ug via RECTAL
  Filled 2015-09-21: qty 2

## 2015-09-21 MED ORDER — PRENATAL MULTIVITAMIN CH
1.0000 | ORAL_TABLET | Freq: Every day | ORAL | Status: DC
  Administered 2015-09-22 – 2015-09-23 (×2): 1 via ORAL
  Filled 2015-09-21 (×2): qty 1

## 2015-09-21 MED ORDER — LACTATED RINGERS IV SOLN: 500.0000 mL | INTRAVENOUS | Status: DC | PRN

## 2015-09-21 MED ORDER — FENTANYL 2.5 MCG/ML BUPIVACAINE 1/10 % EPIDURAL INFUSION (WH - ANES)
INTRAMUSCULAR | Status: DC | PRN
  Administered 2015-09-21: 14 mL/h via EPIDURAL

## 2015-09-21 MED ORDER — ACETAMINOPHEN 325 MG PO TABS: 650.0000 mg | ORAL_TABLET | ORAL | Status: DC | PRN

## 2015-09-21 MED ORDER — OXYCODONE-ACETAMINOPHEN 5-325 MG PO TABS: 1.0000 | ORAL_TABLET | ORAL | Status: DC | PRN

## 2015-09-21 MED ORDER — DIBUCAINE 1 % RE OINT
1.0000 | TOPICAL_OINTMENT | RECTAL | Status: DC | PRN
Start: 2015-09-21 — End: 2015-09-23

## 2015-09-21 MED ORDER — LIDOCAINE HCL (PF) 1 % IJ SOLN
INTRAMUSCULAR | Status: DC | PRN
  Administered 2015-09-21 (×2): 5 mL

## 2015-09-21 MED ORDER — PHENYLEPHRINE 40 MCG/ML (10ML) SYRINGE FOR IV PUSH (FOR BLOOD PRESSURE SUPPORT)
80.0000 ug | PREFILLED_SYRINGE | INTRAVENOUS | Status: DC | PRN
  Filled 2015-09-21: qty 20

## 2015-09-21 MED ORDER — OXYTOCIN 40 UNITS IN LACTATED RINGERS INFUSION - SIMPLE MED: 62.5000 mL/h | INTRAVENOUS | Status: DC | PRN

## 2015-09-21 MED ORDER — SENNOSIDES-DOCUSATE SODIUM 8.6-50 MG PO TABS
2.0000 | ORAL_TABLET | ORAL | Status: DC
  Administered 2015-09-21 – 2015-09-22 (×3): 2 via ORAL
  Filled 2015-09-21 (×3): qty 2

## 2015-09-21 MED ORDER — OXYTOCIN BOLUS FROM INFUSION
500.0000 mL | INTRAVENOUS | Status: DC
  Administered 2015-09-21: 500 mL via INTRAVENOUS

## 2015-09-21 MED ORDER — IBUPROFEN 600 MG PO TABS
600.0000 mg | ORAL_TABLET | Freq: Four times a day (QID) | ORAL | Status: DC | PRN
  Administered 2015-09-21: 600 mg via ORAL
  Filled 2015-09-21: qty 1

## 2015-09-21 MED ORDER — FENTANYL 2.5 MCG/ML BUPIVACAINE 1/10 % EPIDURAL INFUSION (WH - ANES): 14.0000 mL/h | INTRAMUSCULAR | Status: DC | PRN

## 2015-09-21 MED ORDER — CITRIC ACID-SODIUM CITRATE 334-500 MG/5ML PO SOLN
30.0000 mL | ORAL | Status: DC | PRN
Start: 2015-09-21 — End: 2015-09-21

## 2015-09-21 MED ORDER — OXYCODONE-ACETAMINOPHEN 5-325 MG PO TABS: 2.0000 | ORAL_TABLET | ORAL | Status: DC | PRN

## 2015-09-21 MED ORDER — LIDOCAINE HCL (PF) 1 % IJ SOLN: 30.0000 mL | INTRAMUSCULAR | Status: DC | PRN

## 2015-09-21 MED ORDER — FLEET ENEMA 7-19 GM/118ML RE ENEM: 1.0000 | ENEMA | RECTAL | Status: DC | PRN

## 2015-09-21 MED ORDER — OXYCODONE-ACETAMINOPHEN 5-325 MG PO TABS
2.0000 | ORAL_TABLET | ORAL | Status: DC | PRN
Start: 2015-09-21 — End: 2015-09-21

## 2015-09-21 MED ORDER — LANOLIN HYDROUS EX OINT: TOPICAL_OINTMENT | CUTANEOUS | Status: DC | PRN

## 2015-09-21 MED ORDER — ZOLPIDEM TARTRATE 5 MG PO TABS: 5.0000 mg | ORAL_TABLET | Freq: Every evening | ORAL | Status: DC | PRN

## 2015-09-21 MED ORDER — ONDANSETRON HCL 4 MG/2ML IJ SOLN: 4.0000 mg | INTRAMUSCULAR | Status: DC | PRN

## 2015-09-21 MED ORDER — IBUPROFEN 600 MG PO TABS
600.0000 mg | ORAL_TABLET | Freq: Four times a day (QID) | ORAL | Status: DC
  Administered 2015-09-21 – 2015-09-23 (×7): 600 mg via ORAL
  Filled 2015-09-21 (×8): qty 1

## 2015-09-21 MED ORDER — ONDANSETRON HCL 4 MG/2ML IJ SOLN: 4.0000 mg | Freq: Four times a day (QID) | INTRAMUSCULAR | Status: DC | PRN

## 2015-09-21 MED ORDER — DIPHENHYDRAMINE HCL 25 MG PO CAPS: 25.0000 mg | ORAL_CAPSULE | Freq: Four times a day (QID) | ORAL | Status: DC | PRN

## 2015-09-21 MED ORDER — DIPHENHYDRAMINE HCL 50 MG/ML IJ SOLN: 12.5000 mg | INTRAMUSCULAR | Status: DC | PRN

## 2015-09-21 MED ORDER — OXYTOCIN 40 UNITS IN LACTATED RINGERS INFUSION - SIMPLE MED
62.5000 mL/h | INTRAVENOUS | Status: DC
  Filled 2015-09-21: qty 1000

## 2015-09-21 MED ORDER — OXYTOCIN 40 UNITS IN LACTATED RINGERS INFUSION - SIMPLE MED: 62.5000 mL/h | INTRAVENOUS | Status: DC

## 2015-09-21 MED ORDER — SIMETHICONE 80 MG PO CHEW: 80.0000 mg | CHEWABLE_TABLET | ORAL | Status: DC | PRN

## 2015-09-21 MED ORDER — LIDOCAINE HCL (PF) 1 % IJ SOLN
30.0000 mL | INTRAMUSCULAR | Status: DC | PRN
  Filled 2015-09-21: qty 30

## 2015-09-21 MED ORDER — CITRIC ACID-SODIUM CITRATE 334-500 MG/5ML PO SOLN: 30.0000 mL | ORAL | Status: DC | PRN

## 2015-09-21 MED ORDER — OXYTOCIN 40 UNITS IN LACTATED RINGERS INFUSION - SIMPLE MED
1.0000 m[IU]/min | INTRAVENOUS | Status: DC
  Administered 2015-09-21: 2 m[IU]/min via INTRAVENOUS

## 2015-09-21 MED ORDER — ONDANSETRON HCL 4 MG PO TABS: 4.0000 mg | ORAL_TABLET | ORAL | Status: DC | PRN

## 2015-09-21 MED ORDER — OXYTOCIN BOLUS FROM INFUSION: 500.0000 mL | INTRAVENOUS | Status: DC

## 2015-09-21 MED ORDER — INFLUENZA VAC SPLIT QUAD 0.5 ML IM SUSY
0.5000 mL | PREFILLED_SYRINGE | INTRAMUSCULAR | Status: DC
  Filled 2015-09-21: qty 0.5

## 2015-09-21 MED ORDER — WITCH HAZEL-GLYCERIN EX PADS: 1.0000 "application " | MEDICATED_PAD | CUTANEOUS | Status: DC | PRN

## 2015-09-21 MED ORDER — FENTANYL 2.5 MCG/ML BUPIVACAINE 1/10 % EPIDURAL INFUSION (WH - ANES)
14.0000 mL/h | INTRAMUSCULAR | Status: DC | PRN
  Filled 2015-09-21: qty 125

## 2015-09-21 MED ORDER — TETANUS-DIPHTH-ACELL PERTUSSIS 5-2.5-18.5 LF-MCG/0.5 IM SUSP: 0.5000 mL | Freq: Once | INTRAMUSCULAR | Status: DC

## 2015-09-21 MED ORDER — FENTANYL CITRATE (PF) 100 MCG/2ML IJ SOLN
50.0000 ug | INTRAMUSCULAR | Status: DC | PRN
Start: 2015-09-21 — End: 2015-09-21
  Administered 2015-09-21: 100 ug via INTRAVENOUS

## 2015-09-21 MED ORDER — OXYCODONE-ACETAMINOPHEN 5-325 MG PO TABS
2.0000 | ORAL_TABLET | ORAL | Status: DC | PRN
  Administered 2015-09-22 – 2015-09-23 (×3): 2 via ORAL
  Filled 2015-09-21 (×4): qty 2

## 2015-09-21 MED ORDER — OXYCODONE-ACETAMINOPHEN 5-325 MG PO TABS
1.0000 | ORAL_TABLET | ORAL | Status: DC | PRN
  Administered 2015-09-21 – 2015-09-22 (×5): 1 via ORAL
  Filled 2015-09-21 (×5): qty 1

## 2015-09-21 MED ORDER — BENZOCAINE-MENTHOL 20-0.5 % EX AERO
1.0000 "application " | INHALATION_SPRAY | CUTANEOUS | Status: DC | PRN
  Administered 2015-09-21: 1 via TOPICAL
  Filled 2015-09-21: qty 56

## 2015-09-21 MED ORDER — FENTANYL CITRATE (PF) 100 MCG/2ML IJ SOLN
50.0000 ug | INTRAMUSCULAR | Status: DC | PRN
  Filled 2015-09-21: qty 2

## 2015-09-21 MED ORDER — LACTATED RINGERS IV SOLN
INTRAVENOUS | Status: DC
  Administered 2015-09-21: 10:00:00 via INTRAVENOUS

## 2015-09-21 MED ORDER — MISOPROSTOL 200 MCG PO TABS
ORAL_TABLET | ORAL | Status: AC
  Filled 2015-09-21: qty 5

## 2015-09-21 MED ORDER — TERBUTALINE SULFATE 1 MG/ML IJ SOLN: 0.2500 mg | Freq: Once | INTRAMUSCULAR | Status: DC | PRN

## 2015-09-21 MED ORDER — LACTATED RINGERS IV SOLN: INTRAVENOUS | Status: DC

## 2015-09-21 MED ORDER — EPHEDRINE 5 MG/ML INJ: 10.0000 mg | INTRAVENOUS | Status: DC | PRN

## 2015-09-21 NOTE — MAU Note (Signed)
Notified Dr. Clearance CootsHarper patient G3P2 VBAC, SROM around 10:30 pm last night, 6/100/-1  -GBS.

## 2015-09-21 NOTE — Anesthesia Procedure Notes (Signed)
Epidural Patient location during procedure: OB Start time: 09/21/2015 9:22 AM End time: 09/21/2015 9:53 AM  Staffing Anesthesiologist: Sebastian AcheMANNY, Aviv Lengacher  Preanesthetic Checklist Completed: patient identified, site marked, surgical consent, pre-op evaluation, timeout performed, IV checked, risks and benefits discussed and monitors and equipment checked  Epidural Patient position: sitting Prep: Betadine and DuraPrep Patient monitoring: heart rate, continuous pulse ox and blood pressure Approach: midline Location: L3-L4 Injection technique: LOR air  Needle:  Needle type: Tuohy  Needle gauge: 17 G Needle length: 9 cm Needle insertion depth: 8 cm Catheter type: closed end flexible Catheter size: 19 Gauge Catheter at skin depth: 15 cm Test dose: negative  Additional Notes Two skin sticks,  Deep, mild scoliosis, no complicationsReason for block:procedure for pain

## 2015-09-21 NOTE — Progress Notes (Signed)
FHR Category I

## 2015-09-21 NOTE — MAU Note (Signed)
Patient presents with contraction every 5 minutes and possible SROM at 10:30.

## 2015-09-21 NOTE — MAU Note (Signed)
Notified provider of G3P2 VBAC patient. Water broke around 22:30. 6/100/+1, fern test +, GBS -. Provider said she would put in admit orders.

## 2015-09-21 NOTE — MAU Note (Signed)
Bottom dry prior to exam, red tinged fluid  Noted with exam

## 2015-09-21 NOTE — H&P (Signed)
Dana Edwards is a 29 y.o. female presenting for UC's.. Maternal Medical History:  Reason for admission: Rupture of membranes and contractions.   Fetal activity: Perceived fetal activity is normal.   Last perceived fetal movement was within the past hour.    Prenatal complications: no prenatal complications Prenatal Complications - Diabetes: none.    OB History    Gravida Para Term Preterm AB TAB SAB Ectopic Multiple Living   3 2 2  0 0 0 0 0 0 2     Past Medical History  Diagnosis Date  . Medical history non-contributory    Past Surgical History  Procedure Laterality Date  . Cesarean section     Family History: family history is not on file. Social History:  reports that she has never smoked. She has never used smokeless tobacco. She reports that she does not drink alcohol or use illicit drugs.   Prenatal Transfer Tool  Maternal Diabetes: No Genetic Screening: Normal Maternal Ultrasounds/Referrals: Normal Fetal Ultrasounds or other Referrals:  None Maternal Substance Abuse:  No Significant Maternal Medications:  None Significant Maternal Lab Results:  None Other Comments:  None  Review of Systems  All other systems reviewed and are negative.   Dilation: 6 Effacement (%): 100 Station: +1 Exam by:: jolynn Last menstrual period 02/11/2015. Maternal Exam:  Abdomen: Patient reports no abdominal tenderness. Fetal presentation: vertex  Introitus: Normal vulva. Normal vagina.  Pelvis: adequate for delivery.   Cervix: Cervix evaluated by digital exam.     Physical Exam  Nursing note and vitals reviewed. Constitutional: She is oriented to person, place, and time. She appears well-developed and well-nourished.  HENT:  Head: Normocephalic and atraumatic.  Eyes: Conjunctivae are normal. Pupils are equal, round, and reactive to light.  Neck: Normal range of motion. Neck supple.  Cardiovascular: Normal rate and regular rhythm.   Respiratory: Effort normal and breath  sounds normal.  GI: Soft.  Musculoskeletal: Normal range of motion.  Neurological: She is alert and oriented to person, place, and time.  Skin: Skin is warm and dry.    Prenatal labs: ABO, Rh: O/POS/-- (06/13 1151) Antibody: NEG (06/13 1151) Rubella: 2.14 (06/13 1151) RPR: NON REAC (08/08 1025)  HBsAg: NEGATIVE (06/13 1151)  HIV: NONREACTIVE (08/08 1025)  GBS: NOT DETECTED (09/28 1054)   Assessment/Plan: 37 weeks.  Active labor.  Admit.   HARPER,CHARLES A 09/21/2015, 8:19 AM

## 2015-09-21 NOTE — Anesthesia Preprocedure Evaluation (Signed)
Anesthesia Evaluation  Patient identified by MRN, date of birth, ID band Patient awake and Patient confused    Reviewed: Allergy & Precautions, H&P , NPO status , Patient's Chart, lab work & pertinent test results  Airway Mallampati: II       Dental   Pulmonary    Pulmonary exam normal breath sounds clear to auscultation       Cardiovascular Exercise Tolerance: Good Normal cardiovascular exam Rhythm:regular Rate:Normal     Neuro/Psych    GI/Hepatic   Endo/Other  Morbid obesity  Renal/GU      Musculoskeletal   Abdominal   Peds  Hematology   Anesthesia Other Findings   Reproductive/Obstetrics (+) Pregnancy TOLAC, has already had one vag after CS                             Anesthesia Physical Anesthesia Plan  ASA: III  Anesthesia Plan: Epidural   Post-op Pain Management:    Induction:   Airway Management Planned:   Additional Equipment:   Intra-op Plan:   Post-operative Plan:   Informed Consent: I have reviewed the patients History and Physical, chart, labs and discussed the procedure including the risks, benefits and alternatives for the proposed anesthesia with the patient or authorized representative who has indicated his/her understanding and acceptance.     Plan Discussed with:   Anesthesia Plan Comments:         Anesthesia Quick Evaluation

## 2015-09-22 LAB — CBC
HEMATOCRIT: 30.1 % — AB (ref 36.0–46.0)
HEMOGLOBIN: 9.8 g/dL — AB (ref 12.0–15.0)
MCH: 29.3 pg (ref 26.0–34.0)
MCHC: 32.6 g/dL (ref 30.0–36.0)
MCV: 89.9 fL (ref 78.0–100.0)
Platelets: 222 10*3/uL (ref 150–400)
RBC: 3.35 MIL/uL — ABNORMAL LOW (ref 3.87–5.11)
RDW: 14.3 % (ref 11.5–15.5)
WBC: 9.8 10*3/uL (ref 4.0–10.5)

## 2015-09-22 NOTE — Addendum Note (Signed)
Addendum  created 09/22/15 1428 by Lincoln BrighamAngela Draughon Alferd Obryant, CRNA   Modules edited: Charges VN, Notes Section   Notes Section:  File: [id number]

## 2015-09-22 NOTE — Anesthesia Postprocedure Evaluation (Signed)
Anesthesia Post Note  Patient: Dana MoronAmber C Antonson  Procedure(s) Performed: * No procedures listed *  Anesthesia type: Epidural  Patient location: Mother/Baby  Post pain: Pain level controlled  Post assessment: Post-op Vital signs reviewed  Last Vitals:  Filed Vitals:   09/22/15 0509  BP: 94/37  Pulse: 76  Temp: 36.5 C  Resp: 18    Post vital signs: Reviewed  Level of consciousness:alert  Complications: No apparent anesthesia complications

## 2015-09-22 NOTE — Progress Notes (Signed)
UR chart review completed.  

## 2015-09-23 NOTE — Discharge Instructions (Signed)
Discharge instructions   You can wash your hair  Shower  Eat what you want  Drink what you want  See me in 6 weeks  Your ankles are going to swell more in the next 2 weeks than when pregnant  No sex for 6 weeks   Haydan Wedig A, MD 09/23/2015

## 2015-09-23 NOTE — Progress Notes (Signed)
Patient ID: Dana MoronAmber C Purdy, female   DOB: Aug 13, 1986, 29 y.o.   MRN: [id number] Postpartum day 2 Blood pressure 101/51 respiration 19 pulse 61 afebrile Fundus firm Lochia moderate Home today

## 2015-09-23 NOTE — Discharge Summary (Signed)
Obstetric Discharge Summary Reason for Admission: onset of labor Prenatal Procedures: none Intrapartum Procedures: spontaneous vaginal delivery Postpartum Procedures: none Complications-Operative and Postpartum: none HEMOGLOBIN  Date Value Ref Range Status  09/22/2015 9.8* 12.0 - 15.0 g/dL Final   HCT  Date Value Ref Range Status  09/22/2015 30.1* 36.0 - 46.0 % Final    Physical Exam:  General: alert Lochia: appropriate Uterine Fundus: firm Incision: healing well DVT Evaluation: No evidence of DVT seen on physical exam.  Discharge Diagnoses: Term Pregnancy-delivered  Discharge Information: Date: 09/23/2015 Activity: pelvic rest Diet: routine Medications: Percocet Condition: improved Instructions: refer to practice specific booklet Discharge to: home Follow-up Information    Follow up with HARPER,CHARLES A, MD.   Specialty:  Obstetrics and Gynecology   Contact information:   [address] [place] [id number] [phone]       Newborn Data: Live born female  Birth Weight: 6 lb 3.7 oz (2825 g) APGAR: 9, 9  Home with mother.  Dequante Tremaine A 09/23/2015, 7:09 AM

## 2015-09-25 ENCOUNTER — Other Ambulatory Visit: Payer: Self-pay | Admitting: Certified Nurse Midwife

## 2015-09-25 DIAGNOSIS — D500 Iron deficiency anemia secondary to blood loss (chronic): Secondary | ICD-10-CM

## 2015-09-25 MED ORDER — FERIVA 21/7 75-1 MG PO TABS: 1.0000 | ORAL_TABLET | Freq: Every day | ORAL | Status: DC

## 2015-09-28 ENCOUNTER — Encounter: Payer: Medicaid Other | Admitting: Obstetrics

## 2015-09-29 ENCOUNTER — Encounter: Payer: Medicaid Other | Admitting: Obstetrics

## 2015-10-02 ENCOUNTER — Encounter: Payer: Self-pay | Admitting: *Deleted

## 2015-10-02 ENCOUNTER — Encounter: Payer: Medicaid Other | Admitting: Obstetrics

## 2015-10-02 ENCOUNTER — Ambulatory Visit (INDEPENDENT_AMBULATORY_CARE_PROVIDER_SITE_OTHER): Payer: Medicaid Other | Admitting: Obstetrics

## 2015-10-02 ENCOUNTER — Encounter: Payer: Self-pay | Admitting: Obstetrics

## 2015-10-02 DIAGNOSIS — R52 Pain, unspecified: Secondary | ICD-10-CM

## 2015-10-02 DIAGNOSIS — Z3009 Encounter for other general counseling and advice on contraception: Secondary | ICD-10-CM

## 2015-10-02 MED ORDER — OXYCODONE-ACETAMINOPHEN 10-325 MG PO TABS: 1.0000 | ORAL_TABLET | ORAL | Status: DC | PRN

## 2015-10-03 ENCOUNTER — Other Ambulatory Visit: Payer: Self-pay | Admitting: Certified Nurse Midwife

## 2015-10-03 ENCOUNTER — Telehealth: Payer: Self-pay | Admitting: *Deleted

## 2015-10-03 ENCOUNTER — Encounter: Payer: Self-pay | Admitting: Obstetrics

## 2015-10-03 NOTE — Progress Notes (Signed)
Subjective:     Dana Edwards is a 29 y.o. female who presents for a postpartum visit. She is 2 weeks postpartum following a spontaneous vaginal delivery. I have fully reviewed the prenatal and intrapartum course. The delivery was at 37 gestational weeks. Outcome: spontaneous vaginal delivery. Anesthesia: epidural. Postpartum course has been normal. Baby's course has been normal. Baby is feeding by bottle - Similac Advance. Bleeding thin lochia. Bowel function is normal. Bladder function is normal. Patient is not sexually active. Contraception method is abstinence. Postpartum depression screening: negative.  Tobacco, alcohol and substance abuse history reviewed.  Adult immunizations reviewed including TDAP, rubella and varicella.  The following portions of the patient's history were reviewed and updated as appropriate: allergies, current medications, past family history, past medical history, past social history, past surgical history and problem list.  Review of Systems A comprehensive review of systems was negative.   Objective:    BP 132/89 mmHg  Pulse 60  Temp(Src) 98.2 F (36.8 C)  Ht 5\' 7"  (1.702 m)  Wt 260 lb (117.935 kg)  BMI 40.71 kg/m2  LMP 02/11/2015 (Approximate)  Breastfeeding? No   PE: Deferred   100% of 10 min visit spent on counseling and coordination of care.  Assessment:    2 weeks postpartum.  Doing well.  Contraceptive counseling and advice  Plan:    1. Contraception: Considering IUD 2. Continue PNV's 3. Follow up in: 4 weeks or as needed.   Healthy lifestyle practices reviewed

## 2015-10-03 NOTE — Telephone Encounter (Signed)
Patient states the sponge wrap came off the circumcision site yesterday and she wanted to know if that was ok. 2:49 Attempted to call patient back- subscriber not in service

## 2015-10-04 ENCOUNTER — Encounter: Payer: Medicaid Other | Admitting: Obstetrics

## 2015-10-12 ENCOUNTER — Encounter: Payer: Medicaid Other | Admitting: Obstetrics

## 2015-10-30 ENCOUNTER — Ambulatory Visit: Payer: Medicaid Other | Admitting: Obstetrics

## 2016-04-06 ENCOUNTER — Emergency Department (HOSPITAL_COMMUNITY)
Admission: EM | Admit: 2016-04-06 | Discharge: 2016-04-07 | Disposition: A | Discharge status: Home / Self Care | Payer: Medicaid Other | Attending: Emergency Medicine | Admitting: Emergency Medicine

## 2016-04-06 ENCOUNTER — Encounter (HOSPITAL_COMMUNITY): Payer: Self-pay | Admitting: Emergency Medicine

## 2016-04-06 DIAGNOSIS — S80811A Abrasion, right lower leg, initial encounter: Secondary | ICD-10-CM | POA: Insufficient documentation

## 2016-04-06 DIAGNOSIS — Y9301 Activity, walking, marching and hiking: Secondary | ICD-10-CM | POA: Insufficient documentation

## 2016-04-06 DIAGNOSIS — T148XXA Other injury of unspecified body region, initial encounter: Secondary | ICD-10-CM

## 2016-04-06 DIAGNOSIS — Y998 Other external cause status: Secondary | ICD-10-CM | POA: Diagnosis not present

## 2016-04-06 DIAGNOSIS — S8991XA Unspecified injury of right lower leg, initial encounter: Secondary | ICD-10-CM | POA: Diagnosis present

## 2016-04-06 DIAGNOSIS — Y9289 Other specified places as the place of occurrence of the external cause: Secondary | ICD-10-CM | POA: Insufficient documentation

## 2016-04-06 DIAGNOSIS — Z79899 Other long term (current) drug therapy: Secondary | ICD-10-CM | POA: Diagnosis not present

## 2016-04-06 DIAGNOSIS — W108XXA Fall (on) (from) other stairs and steps, initial encounter: Secondary | ICD-10-CM | POA: Insufficient documentation

## 2016-04-06 DIAGNOSIS — Z23 Encounter for immunization: Secondary | ICD-10-CM | POA: Diagnosis not present

## 2016-04-06 MED ORDER — TETANUS-DIPHTH-ACELL PERTUSSIS 5-2.5-18.5 LF-MCG/0.5 IM SUSP
0.5000 mL | Freq: Once | INTRAMUSCULAR | Status: AC
  Administered 2016-04-06: 0.5 mL via INTRAMUSCULAR
  Filled 2016-04-06: qty 0.5

## 2016-04-06 MED ORDER — ACETAMINOPHEN 500 MG PO TABS
1000.0000 mg | ORAL_TABLET | Freq: Once | ORAL | Status: AC
  Administered 2016-04-06: 1000 mg via ORAL
  Filled 2016-04-06: qty 2

## 2016-04-06 MED ORDER — BACITRACIN ZINC 500 UNIT/GM EX OINT
1.0000 "application " | TOPICAL_OINTMENT | Freq: Two times a day (BID) | CUTANEOUS | Status: DC
  Administered 2016-04-06: 1 via TOPICAL
  Filled 2016-04-06: qty 0.9

## 2016-04-06 NOTE — ED Notes (Signed)
Pt verbalized understanding of d/c instructions and has no further questions. Pt stable and NAD.  

## 2016-04-06 NOTE — ED Notes (Signed)
Patient arrives with complaint of right lower leg abrasions. Injury occurred when she slipped while walking on steps. Ambulatory after accident. Denies hip, knee, and ankle pain.

## 2016-04-06 NOTE — ED Provider Notes (Signed)
CSN: [id number]     Arrival date & time 04/06/16  2152 History  By signing my name below, I, Dana Edwards, attest that this documentation has been prepared under the direction and in the presence of Roxy Horsemanobert Bekki Tavenner, PA-C. Electronically Signed: Angelene GiovanniEmmanuella Edwards, ED Scribe. 04/06/2016. 11:06 PM.    Chief Complaint  Patient presents with  . Abrasion   The history is provided by the patient. No language interpreter was used.   HPI Comments: Dana Edwards is a 30 y.o. female who presents to the Emergency Department complaining of multiple bleeding abrasions to her right lower leg s/p fall that occurred PTA. She explains that she missed a step as she was walking down the concrete steps, causing her to fall. No LOC or head injuries. Pt denies any other injuries. Pt is unsure of her last tetanus vaccine. No fever, chills, or any gait problems.   Past Medical History  Diagnosis Date  . Medical history non-contributory    Past Surgical History  Procedure Laterality Date  . Cesarean section     History reviewed. No pertinent family history. Social History  Substance Use Topics  . Smoking status: Never Smoker   . Smokeless tobacco: Never Used  . Alcohol Use: No   OB History    Gravida Para Term Preterm AB TAB SAB Ectopic Multiple Living   3 3 3  0 0 0 0 0 0 3     Review of Systems  Constitutional: Negative for fever and chills.  Gastrointestinal: Negative for nausea and vomiting.  Skin: Positive for wound.      Allergies  Review of patient's allergies indicates no known allergies.  Home Medications   Prior to Admission medications   Medication Sig Start Date End Date Taking? Authorizing Provider  [id number]-Zn (FERIVA 21/7) 75-1 MG TABS Take 1 tablet by mouth daily with breakfast. 09/25/15   Rachelle A Denney, CNM  oxyCODONE-acetaminophen (PERCOCET) 10-325 MG tablet Take 1 tablet by mouth every 4 (four) hours as needed for pain. 10/02/15   Brock Badharles A Harper,  MD   BP 117/78 mmHg  Pulse 105  Temp(Src) 99.4 F (37.4 C) (Oral)  Resp 16  SpO2 99% Physical Exam  Constitutional: She is oriented to person, place, and time. She appears well-developed and well-nourished.  HENT:  Head: Normocephalic and atraumatic.  Eyes: Conjunctivae and EOM are normal.  Neck: Normal range of motion.  Cardiovascular: Normal rate.   Pulmonary/Chest: Effort normal.  Abdominal: She exhibits no distension.  Musculoskeletal: Normal range of motion.  Neurological: She is alert and oriented to person, place, and time.  Skin: Skin is warm and dry.     Large abrasion to right shin, no foreign body, no lacerations  Psychiatric: She has a normal mood and affect. Her behavior is normal. Judgment and thought content normal.  Nursing note and vitals reviewed.   ED Course  Procedures (including critical care time) DIAGNOSTIC STUDIES: Oxygen Saturation is 99% on RA, normal by my interpretation.    COORDINATION OF CARE: 10:57 PM- Pt advised of plan for treatment and pt agrees. Pt will receive wound care here.   MDM   Final diagnoses:  Abrasion    Patient with a large abrasion to right shin. The wound was cleansed and dressed in the emergency department. Tetanus shot updated. No lacerations. Patient is ambulatory. Discharged home.  I personally performed the services described in this documentation, which was scribed in my presence. The recorded information has been reviewed and is accurate.  Roxy Horseman, PA-C 04/07/16 [id number]  Pricilla Loveless, MD 04/11/16 [phone]

## 2016-04-06 NOTE — Discharge Instructions (Signed)
Abrasion An abrasion is a cut or scrape on the outer surface of your skin. An abrasion does not extend through all of the layers of your skin. It is important to care for your abrasion properly to prevent infection. CAUSES Most abrasions are caused by falling on or gliding across the ground or another surface. When your skin rubs on something, the outer and inner layer of skin rubs off.  SYMPTOMS A cut or scrape is the main symptom of this condition. The scrape may be bleeding, or it may appear red or pink. If there was an associated fall, there may be an underlying bruise. DIAGNOSIS An abrasion is diagnosed with a physical exam. TREATMENT Treatment for this condition depends on how large and deep the abrasion is. Usually, your abrasion will be cleaned with water and mild soap. This removes any dirt or debris that may be stuck. An antibiotic ointment may be applied to the abrasion to help prevent infection. A bandage (dressing) may be placed on the abrasion to keep it clean. You may also need a tetanus shot. HOME CARE INSTRUCTIONS Medicines  Take or apply medicines only as directed by your health care provider.  If you were prescribed an antibiotic ointment, finish all of it even if you start to feel better. Wound Care  Clean the wound with mild soap and water 2-3 times per day or as directed by your health care provider. Pat your wound dry with a clean towel. Do not rub it.  There are many different ways to close and cover a wound. Follow instructions from your health care provider about:  Wound care.  Dressing changes and removal.  Check your wound every day for signs of infection. Watch for:  Redness, swelling, or pain.  Fluid, blood, or pus. General Instructions  Keep the dressing dry as directed by your health care provider. Do not take baths, swim, use a hot tub, or do anything that would put your wound underwater until your health care provider approves.  If there is  swelling, raise (elevate) the injured area above the level of your heart while you are sitting or lying down.  Keep all follow-up visits as directed by your health care provider. This is important. SEEK MEDICAL CARE IF:  You received a tetanus shot and you have swelling, severe pain, redness, or bleeding at the injection site.  Your pain is not controlled with medicine.  You have increased redness, swelling, or pain at the site of your wound. SEEK IMMEDIATE MEDICAL CARE IF:  You have a red streak going away from your wound.  You have a fever.  You have fluid, blood, or pus coming from your wound.  You notice a bad smell coming from your wound or your dressing.   This information is not intended to replace advice given to you by your health care provider. Make sure you discuss any questions you have with your health care provider.   Document Released: 09/04/2005 Document Revised: 08/16/2015 Document Reviewed: 11/23/2014 Elsevier Interactive Patient Education 2016 [employer]  

## 2016-04-11 ENCOUNTER — Emergency Department (HOSPITAL_COMMUNITY)
Admission: EM | Admit: 2016-04-11 | Discharge: 2016-04-11 | Disposition: A | Discharge status: Home / Self Care | Payer: Medicaid Other | Attending: Emergency Medicine | Admitting: Emergency Medicine

## 2016-04-11 ENCOUNTER — Encounter (HOSPITAL_COMMUNITY): Payer: Self-pay

## 2016-04-11 DIAGNOSIS — Z79899 Other long term (current) drug therapy: Secondary | ICD-10-CM | POA: Insufficient documentation

## 2016-04-11 DIAGNOSIS — W108XXD Fall (on) (from) other stairs and steps, subsequent encounter: Secondary | ICD-10-CM | POA: Insufficient documentation

## 2016-04-11 DIAGNOSIS — S80811D Abrasion, right lower leg, subsequent encounter: Secondary | ICD-10-CM | POA: Diagnosis not present

## 2016-04-11 DIAGNOSIS — S8991XD Unspecified injury of right lower leg, subsequent encounter: Secondary | ICD-10-CM | POA: Diagnosis present

## 2016-04-11 MED ORDER — CEPHALEXIN 500 MG PO CAPS: 500.0000 mg | ORAL_CAPSULE | Freq: Four times a day (QID) | ORAL | Status: DC

## 2016-04-11 MED ORDER — BACITRACIN ZINC 500 UNIT/GM EX OINT
TOPICAL_OINTMENT | Freq: Two times a day (BID) | CUTANEOUS | Status: DC
  Administered 2016-04-11: 15:00:00 via TOPICAL

## 2016-04-11 NOTE — ED Provider Notes (Signed)
CSN: [id number]     Arrival date & time 04/11/16  1424 History  By signing my name below, I, Dana Edwards, attest that this documentation has been prepared under the direction and in the presence of Dana Crigler, PA-C. Electronically Signed: Evon Edwards, ED Scribe. 04/11/2016. 2:58 PM.      Chief Complaint  Patient presents with  . recheck road rash     The history is provided by the patient. No language interpreter was used.   HPI Comments: ORION VANDERVORT is a 30 y.o. female who presents to the Emergency Department for recheck of abrasions to her right lower leg. Pt states she has noticed some clear, slight- yellowish drainage. Pt states she has been keeping the wound clean. Pt states she was applying an ointment and peroxide as well. She states she initially injured her leg while walking down concrete steps, missed a step and fell. Pt denies pus drainage, fever, nausea or vomiting.   Past Medical History  Diagnosis Date  . Medical history non-contributory    Past Surgical History  Procedure Laterality Date  . Cesarean section     No family history on file. Social History  Substance Use Topics  . Smoking status: Never Smoker   . Smokeless tobacco: Never Used  . Alcohol Use: No   OB History    Gravida Para Term Preterm AB TAB SAB Ectopic Multiple Living   0 0 0 0 0 0 3      Review of Systems  Constitutional: Negative for fever.  Gastrointestinal: Negative for nausea and vomiting.  Skin: Positive for wound. Negative for color change.   Allergies  Review of patient's allergies indicates no known allergies.  Home Medications   Prior to Admission medications   Medication Sig Start Date End Date Taking? Authorizing Provider  FeAsp-B12-FA-C-DSS-SuccAc-Zn (FERIVA 21/7) 75-1 MG TABS Take 1 tablet by mouth daily with breakfast. 09/25/15   Rachelle A Denney, CNM  oxyCODONE-acetaminophen (PERCOCET) 10-325 MG tablet Take 1 tablet by mouth every 4 (four) hours as  needed for pain. 10/02/15   Brock Bad, MD   BP 120/103 mmHg  Pulse 108  Temp(Src) 98.4 F (36.9 C) (Oral)  Resp 18  SpO2 100%   Physical Exam  Constitutional: She is oriented to person, place, and time. She appears well-developed and well-nourished. No distress.  HENT:  Head: Normocephalic and atraumatic.  Eyes: Conjunctivae and EOM are normal.  Neck: Normal range of motion. Neck supple. No tracheal deviation present.  Cardiovascular: Normal rate.  Exam reveals no decreased pulses.   Pulmonary/Chest: Effort normal. No respiratory distress.  Musculoskeletal: She exhibits tenderness.  Neurological: She is alert and oriented to person, place, and time. No sensory deficit.  Motor, sensation, and vascular distal to the injury is fully intact.   Skin: Skin is warm and dry.  Patient with large abrasions R lower extremities. There is some clear yellow drainage and crusting but no pus. No warmth or redness suggesting cellulitis.   Psychiatric: She has a normal mood and affect. Her behavior is normal.  Nursing note and vitals reviewed.        ED Course  Procedures (including critical care time) DIAGNOSTIC STUDIES: Oxygen Saturation is 100% on RA, normal by my interpretation.    COORDINATION OF CARE: 2:56 PM-Discussed treatment plan with pt at bedside and pt agreed to plan.   Wound redressed. Will give 5 days of Keflex. Pt urged to return with worsening pain, worsening swelling, expanding  area of redness or streaking up extremity, fever, or any other concerns. Urged to take complete course of antibiotics as prescribed. Pt verbalizes understanding and agrees with plan.  Vital signs reviewed and are as follows: Filed Vitals:   04/11/16 1438  BP: 120/103  Pulse: 108  Temp: 98.4 F (36.9 C)  Resp: 18    MDM   Final diagnoses:  Abrasion of lower leg, right, subsequent encounter   Patient with large abrasions. No acute infections. Patient is worried about infection, and  has had some increased clear, non-purulent drainage. Feel short course of keflex is reasonable given the amount of skin disruption. Encouraged not to use hydrogen peroxide. Otherwise, wound appears to be healing as expected.   I personally performed the services described in this documentation, which was scribed in my presence. The recorded information has been reviewed and is accurate.      Dana CriglerJoshua Quint Chestnut, PA-C 04/11/16 1535  Linwood DibblesJon Knapp, MD 04/12/16 [phone]

## 2016-04-11 NOTE — Discharge Instructions (Signed)
Please read and follow all provided instructions.  Your diagnoses today include:  1. Abrasion of lower leg, right, subsequent encounter    Tests performed today include:  Vital signs. See below for your results today.   Medications prescribed:   Keflex (cephalexin) - antibiotic  You have been prescribed an antibiotic medicine: take the entire course of medicine even if you are feeling better. Stopping early can cause the antibiotic not to work.  Take any prescribed medications only as directed.   Home care instructions:  Follow any educational materials contained in this packet. Keep affected area above the level of your heart when possible. Wash area gently twice a day with warm soapy water. Do not apply alcohol or hydrogen peroxide. Cover the area if it draining or weeping.   Follow-up instructions: Please follow-up with your primary care provider in the next 1 week for further evaluation of your symptoms.   Return instructions:  Return to the Emergency Department if you have:  Fever  Worsening symptoms  Worsening pain  Worsening swelling  Redness of the skin that moves away from the affected area, especially if it streaks away from the affected area   Any other emergent concerns  Your vital signs today were: BP 120/103 mmHg   Pulse 108   Temp(Src) 98.4 F (36.9 C) (Oral)   Resp 18   SpO2 100% If your blood pressure (BP) was elevated above 135/85 this visit, please have this repeated by your doctor within one month. --------------

## 2016-04-11 NOTE — ED Notes (Signed)
Called x 2 for patient.   No answer.  

## 2016-04-11 NOTE — ED Notes (Signed)
Patient here to have recheck of right leg road rash, no drainage noted

## 2016-05-10 ENCOUNTER — Emergency Department (HOSPITAL_COMMUNITY): Payer: Medicaid Other

## 2016-05-10 ENCOUNTER — Encounter (HOSPITAL_COMMUNITY): Payer: Self-pay | Admitting: Emergency Medicine

## 2016-05-10 ENCOUNTER — Emergency Department (HOSPITAL_COMMUNITY)
Admission: EM | Admit: 2016-05-10 | Discharge: 2016-05-10 | Disposition: A | Discharge status: Home / Self Care | Payer: Medicaid Other | Attending: Emergency Medicine | Admitting: Emergency Medicine

## 2016-05-10 DIAGNOSIS — R1013 Epigastric pain: Secondary | ICD-10-CM | POA: Diagnosis present

## 2016-05-10 DIAGNOSIS — Z7982 Long term (current) use of aspirin: Secondary | ICD-10-CM | POA: Insufficient documentation

## 2016-05-10 DIAGNOSIS — K8020 Calculus of gallbladder without cholecystitis without obstruction: Secondary | ICD-10-CM

## 2016-05-10 DIAGNOSIS — R0789 Other chest pain: Secondary | ICD-10-CM | POA: Diagnosis not present

## 2016-05-10 LAB — CBC WITH DIFFERENTIAL/PLATELET
BASOS PCT: 0 %
Basophils Absolute: 0 10*3/uL (ref 0.0–0.1)
Eosinophils Absolute: 0 10*3/uL (ref 0.0–0.7)
Eosinophils Relative: 1 %
HEMATOCRIT: 38.5 % (ref 36.0–46.0)
Hemoglobin: 12.1 g/dL (ref 12.0–15.0)
Lymphocytes Relative: 27 %
Lymphs Abs: 2.2 10*3/uL (ref 0.7–4.0)
MCH: 28.9 pg (ref 26.0–34.0)
MCHC: 31.4 g/dL (ref 30.0–36.0)
MCV: 92.1 fL (ref 78.0–100.0)
MONO ABS: 0.5 10*3/uL (ref 0.1–1.0)
MONOS PCT: 6 %
NEUTROS ABS: 5.4 10*3/uL (ref 1.7–7.7)
Neutrophils Relative %: 66 %
Platelets: 328 10*3/uL (ref 150–400)
RBC: 4.18 MIL/uL (ref 3.87–5.11)
RDW: 13.3 % (ref 11.5–15.5)
WBC: 8.1 10*3/uL (ref 4.0–10.5)

## 2016-05-10 LAB — COMPREHENSIVE METABOLIC PANEL
ALBUMIN: 3.6 g/dL (ref 3.5–5.0)
ALT: 14 U/L (ref 14–54)
ANION GAP: 10 (ref 5–15)
AST: 15 U/L (ref 15–41)
Alkaline Phosphatase: 51 U/L (ref 38–126)
BILIRUBIN TOTAL: 0.2 mg/dL — AB (ref 0.3–1.2)
BUN: 14 mg/dL (ref 6–20)
CO2: 25 mmol/L (ref 22–32)
Calcium: 9.1 mg/dL (ref 8.9–10.3)
Chloride: 103 mmol/L (ref 101–111)
Creatinine, Ser: 1.04 mg/dL — ABNORMAL HIGH (ref 0.44–1.00)
GFR calc Af Amer: >60 mL/min (ref >60)
Glucose, Bld: 105 mg/dL — ABNORMAL HIGH (ref 65–99)
POTASSIUM: 3.9 mmol/L (ref 3.5–5.1)
Sodium: 138 mmol/L (ref 135–145)
Total Protein: 8 g/dL (ref 6.5–8.1)

## 2016-05-10 LAB — LIPASE, BLOOD: Lipase: 19 U/L (ref 11–51)

## 2016-05-10 LAB — I-STAT TROPONIN, ED: Troponin i, poc: 0 ng/mL (ref 0.00–0.08)

## 2016-05-10 MED ORDER — HYDROMORPHONE HCL 1 MG/ML IJ SOLN
1.0000 mg | Freq: Once | INTRAMUSCULAR | Status: AC
Start: 2016-05-10 — End: 2016-05-10
  Administered 2016-05-10: 1 mg via INTRAVENOUS
  Filled 2016-05-10: qty 1

## 2016-05-10 MED ORDER — METOCLOPRAMIDE HCL 5 MG/ML IJ SOLN
10.0000 mg | Freq: Once | INTRAMUSCULAR | Status: AC
  Administered 2016-05-10: 10 mg via INTRAVENOUS
  Filled 2016-05-10: qty 2

## 2016-05-10 MED ORDER — SODIUM CHLORIDE 0.9 % IV BOLUS (SEPSIS)
1000.0000 mL | Freq: Once | INTRAVENOUS | Status: AC
  Administered 2016-05-10: 1000 mL via INTRAVENOUS

## 2016-05-10 MED ORDER — ONDANSETRON 4 MG PO TBDP: 4.0000 mg | ORAL_TABLET | Freq: Three times a day (TID) | ORAL | Status: DC | PRN

## 2016-05-10 MED ORDER — FAMOTIDINE IN NACL 20-0.9 MG/50ML-% IV SOLN
20.0000 mg | Freq: Two times a day (BID) | INTRAVENOUS | Status: DC
  Administered 2016-05-10: 20 mg via INTRAVENOUS
  Filled 2016-05-10: qty 50

## 2016-05-10 MED ORDER — LORAZEPAM 2 MG/ML IJ SOLN
0.5000 mg | Freq: Once | INTRAMUSCULAR | Status: AC
  Administered 2016-05-10: 0.5 mg via INTRAVENOUS
  Filled 2016-05-10: qty 1

## 2016-05-10 MED ORDER — FAMOTIDINE 20 MG PO TABS: 20.0000 mg | ORAL_TABLET | Freq: Two times a day (BID) | ORAL | Status: DC

## 2016-05-10 MED ORDER — HYDROCODONE-ACETAMINOPHEN 5-325 MG PO TABS: 2.0000 | ORAL_TABLET | ORAL | Status: DC | PRN

## 2016-05-10 MED ORDER — GI COCKTAIL ~~LOC~~
30.0000 mL | Freq: Once | ORAL | Status: AC
Start: 2016-05-10 — End: 2016-05-10
  Administered 2016-05-10: 30 mL via ORAL
  Filled 2016-05-10: qty 30

## 2016-05-10 MED ORDER — ONDANSETRON HCL 4 MG/2ML IJ SOLN
4.0000 mg | Freq: Once | INTRAMUSCULAR | Status: AC
  Administered 2016-05-10: 4 mg via INTRAVENOUS
  Filled 2016-05-10: qty 2

## 2016-05-10 MED ORDER — HYDROCODONE-ACETAMINOPHEN 5-325 MG PO TABS
2.0000 | ORAL_TABLET | Freq: Once | ORAL | Status: AC
Start: 2016-05-10 — End: 2016-05-10
  Administered 2016-05-10: 2 via ORAL
  Filled 2016-05-10: qty 2

## 2016-05-10 NOTE — ED Provider Notes (Signed)
CSN: [id number]     Arrival date & time 05/10/16  0448 History   First MD Initiated Contact with Patient 05/10/16 0503     Chief Complaint  Patient presents with  . Chest Pain     (Consider location/radiation/quality/duration/timing/severity/associated sxs/prior Treatment) HPI  Pt with no significant PMH comes to the ER complaining Of 3 days of epigastric, central chest pain and back pain. The symptoms have been waxing and waning. She took aspirin which helps initially but then it came back and even more severe. The patient is tearful. She describes as sharp and pressure. She feels like it goes from his stomach up towards her esophagus. She is also been having burping and acid in her throat. She has not had any cough, fevers, shortness of breath, diarrhea, lower abdominal pain, dysuria, vaginal complaints. She has endorsed having some nausea with vomiting.  Past Medical History  Diagnosis Date  . Medical history non-contributory    Past Surgical History  Procedure Laterality Date  . Cesarean section     No family history on file. Social History  Substance Use Topics  . Smoking status: Never Smoker   . Smokeless tobacco: Never Used  . Alcohol Use: No   OB History    Gravida Para Term Preterm AB TAB SAB Ectopic Multiple Living   3 3 3  0 0 0 0 0 0 3     Review of Systems  Review of Systems All other systems negative except as documented in the HPI. All pertinent positives and negatives as reviewed in the HPI.   Allergies  Review of patient's allergies indicates no known allergies.  Home Medications   Prior to Admission medications   Medication Sig Start Date End Date Taking? Authorizing Provider  aspirin 325 MG tablet Take 650 mg by mouth once.   Yes Historical Provider, MD   BP 102/89 mmHg  Pulse 88  Temp(Src) 98 F (36.7 C) (Oral)  Resp 18  SpO2 100%  LMP 05/10/2016 (Exact Date) Physical Exam  Constitutional: She appears well-developed and well-nourished. No  distress.  HENT:  Head: Normocephalic and atraumatic.  Right Ear: Tympanic membrane and ear canal normal.  Left Ear: Tympanic membrane and ear canal normal.  Nose: Nose normal.  Mouth/Throat: Uvula is midline, oropharynx is clear and moist and mucous membranes are normal.  Eyes: Pupils are equal, round, and reactive to light.  Neck: Normal range of motion. Neck supple.  Cardiovascular: Normal rate and regular rhythm.   Pulmonary/Chest: Effort normal.    Tenderness to palpation of central chest  Abdominal: Soft. There is tenderness (mild RUQ tenderness) in the epigastric area. There is guarding (voluntary). There is no rigidity, no rebound and no CVA tenderness.  No signs of abdominal distention  Musculoskeletal:  No LE swelling  Neurological: She is alert.  Acting at baseline  Skin: Skin is warm and dry. No rash noted.  Psychiatric: Her mood appears anxious.  + tearful  Nursing note and vitals reviewed.   ED Course  Procedures (including critical care time) Labs Review Labs Reviewed  CBC WITH DIFFERENTIAL/PLATELET  COMPREHENSIVE METABOLIC PANEL  LIPASE, BLOOD  I-STAT TROPOININ, ED  I-STAT TROPOININ, ED    Imaging Review No results found. I have personally reviewed and evaluated these images and lab results as part of my medical decision-making.   EKG Interpretation   Date/Time:  Friday May 10 2016 04:53:04 EDT Ventricular Rate:  65 PR Interval:  150 QRS Duration: 108 QT Interval:  428 QTC Calculation:  445 R Axis:   31 Text Interpretation:  Sinus rhythm Low voltage, precordial leads No  significant change since last tracing Confirmed by Bebe Shaggy  MD, DONALD  [phone]) on 05/10/2016 4:59:24 AM      MDM   Final diagnoses:  None    Symptoms are most consistent with peptic ulcer. The patient denies having hx of acid reflux, does admit to significant NSAID usage.   Medications  famotidine (PEPCID) IVPB 20 mg premix (20 mg Intravenous New Bag/Given 05/10/16 0540)   gi cocktail (Maalox,Lidocaine,Donnatal) (30 mLs Oral Given 05/10/16 0533)  LORazepam (ATIVAN) injection 0.5 mg (0.5 mg Intravenous Given 05/10/16 0533)  ondansetron (ZOFRAN) injection 4 mg (4 mg Intravenous Given 05/10/16 0547)   CBC, CMP, lipase, Troponin and chest xray ordered for evaluation. Pt does have some RUQ tenderness and may need a RUQ [place].  At end of shift, patient sign out to Langston Masker, PA-C. Labs and images are pending.    Marlon Pel, PA-C 05/10/16 6045  Zadie Rhine, MD 05/10/16 [phone]

## 2016-05-10 NOTE — ED Notes (Signed)
Taken to Xray.

## 2016-05-10 NOTE — ED Notes (Signed)
US called and made aware pt is ready for transport. Prior pt was sitting on side of bed, vomiting, pt on her period, and pt needed full gown and linen change, along with floor clean up. Pt given mesh underwear, multiple pads, paper pants, linen changed.

## 2016-05-10 NOTE — Discharge Instructions (Signed)
Cholelithiasis °Cholelithiasis (also called gallstones) is a form of gallbladder disease in which gallstones form in your gallbladder. The gallbladder is an organ that stores bile made in the liver, which helps digest fats. Gallstones begin as small crystals and slowly grow into stones. Gallstone pain occurs when the gallbladder spasms and a gallstone is blocking the duct. Pain can also occur when a stone passes out of the duct.  °RISK FACTORS °· Being female.   °· Having multiple pregnancies. Health care providers sometimes advise removing diseased gallbladders before future pregnancies.   °· Being obese. °· Eating a diet heavy in fried foods and fat.   °· Being older than 60 years and increasing age.   °· Prolonged use of medicines containing female hormones.   °· Having diabetes mellitus.   °· Rapidly losing weight.   °· Having a family history of gallstones (heredity).   °SYMPTOMS °· Nausea.   °· Vomiting. °· Abdominal pain.   °· Yellowing of the skin (jaundice).   °· Sudden pain. It may persist from several minutes to several hours. °· Fever.   °· Tenderness to the touch.  °In some cases, when gallstones do not move into the bile duct, people have no pain or symptoms. These are called "silent" gallstones.  °TREATMENT °Silent gallstones do not need treatment. In severe cases, emergency surgery may be required. Options for treatment include: °· Surgery to remove the gallbladder. This is the most common treatment. °· Medicines. These do not always work and may take 6-12 months or more to work. °· Shock wave treatment (extracorporeal biliary lithotripsy). In this treatment an ultrasound machine sends shock waves to the gallbladder to break gallstones into smaller pieces that can pass into the intestines or be dissolved by medicine. °HOME CARE INSTRUCTIONS  °· Only take over-the-counter or prescription medicines for pain, discomfort, or fever as directed by your health care provider.   °· Follow a low-fat diet until  seen again by your health care provider. Fat causes the gallbladder to contract, which can result in pain.   °· Follow up with your health care provider as directed. Attacks are almost always recurrent and surgery is usually required for permanent treatment.   °SEEK IMMEDIATE MEDICAL CARE IF:  °· Your pain increases and is not controlled by medicines.   °· You have a fever or persistent symptoms for more than 2-3 days.   °· You have a fever and your symptoms suddenly get worse.   °· You have persistent nausea and vomiting.   °MAKE SURE YOU:  °· Understand these instructions. °· Will watch your condition. °· Will get help right away if you are not doing well or get worse. °  °This information is not intended to replace advice given to you by your health care provider. Make sure you discuss any questions you have with your health care provider. °  °Document Released: 11/21/2005 Document Revised: 07/28/2013 Document Reviewed: 05/19/2013 °Elsevier Interactive Patient Education ©2016 [employer] ° °Biliary Colic °Biliary colic is a pain in the upper abdomen. The pain: °· Is usually felt on the right side of the abdomen, but it may also be felt in the center of the abdomen, just below the breastbone (sternum). °· May spread back toward the right shoulder blade. °· May be steady or irregular. °· May be accompanied by nausea and vomiting. °Most of the time, the pain goes away in 1-5 hours. After the most intense pain passes, the abdomen may continue to ache mildly for about 24 hours. °Biliary colic is caused by a blockage in the bile duct. The bile duct is a   pathway that carries bile--a liquid that helps to digest fats--from the gallbladder to the small intestine. Biliary colic usually occurs after eating, when the digestive system demands bile. The pain develops when muscle cells contract forcefully to try to move the blockage so that bile can get by. °HOME CARE INSTRUCTIONS °· Take medicines only as directed by your  health care provider. °· Drink enough fluid to keep your urine clear or pale yellow. °· Avoid fatty, greasy, and fried foods. These kinds of foods increase your body's demand for bile. °· Avoid any foods that make your pain worse. °· Avoid overeating. °· Avoid having a large meal after fasting. °SEEK MEDICAL CARE IF: °· You develop a fever. °· Your pain gets worse. °· You vomit. °· You develop nausea that prevents you from eating and drinking. °SEEK IMMEDIATE MEDICAL CARE IF: °· You suddenly develop a fever and shaking chills. °· You develop a yellowish discoloration (jaundice) of: °¨ Skin. °¨ Whites of the eyes. °¨ Mucous membranes. °· You have continuous or severe pain that is not relieved with medicines. °· You have nausea and vomiting that is not relieved with medicines. °· You develop dizziness or you faint. °  °This information is not intended to replace advice given to you by your health care provider. Make sure you discuss any questions you have with your health care provider. °  °Document Released: 04/28/2006 Document Revised: 04/11/2015 Document Reviewed: 09/06/2014 °Elsevier Interactive Patient Education ©2016 [employer] Inc[employer] ° °

## 2016-05-10 NOTE — ED Notes (Signed)
Patient to ED c/o central CP radiating to upper abdomen and back - pt described it as sharp and pressure. Pt reports it occurred yesterday, went away after taking ASA, but came back this morning and is more intense. Patient tearful at this time, A&O x 4.

## 2016-05-10 NOTE — ED Notes (Signed)
Pt requesting pain medicine; Pt reports pain 9/10 in upper abdomen; RN notified

## 2016-05-10 NOTE — ED Notes (Signed)
US called to inquire if pt is ready for another attempt at US. Pt reports she is not feeling dizzy anymore but she needs pain medication before she gets US. PA made aware.

## 2016-05-10 NOTE — ED Notes (Signed)
Pt given fresh paper pants, mesh underwear, sanitary pads. Is calling her grandparents to come get her.

## 2016-05-10 NOTE — ED Provider Notes (Signed)
Pt's labs returned.  Pt has normal wbc's on cbc.  Cmet shows normal lft's.   Ultrasound shows gallstones.  Pt counseled on results.  Pt reports she is feeling better.   Pt advised to follow up with [facility] for evaluation.   An After Visit Summary was printed and given to the patient. Meds ordered this encounter  Medications  . famotidine (PEPCID) IVPB 20 mg premix    Sig:   . gi cocktail (Maalox,Lidocaine,Donnatal)    Sig:   . LORazepam (ATIVAN) injection 0.5 mg    Sig:   . ondansetron (ZOFRAN) injection 4 mg    Sig:   . aspirin 325 MG tablet    Sig: Take 650 mg by mouth once.  . sodium chloride 0.9 % bolus 1,000 mL    Sig:   . HYDROmorphone (DILAUDID) injection 1 mg    Sig:   . metoCLOPramide (REGLAN) injection 10 mg    Sig:   . famotidine (PEPCID) 20 MG tablet    Sig: Take 1 tablet (20 mg total) by mouth 2 (two) times daily.    Dispense:  30 tablet    Refill:  0    Order Specific Question:  Supervising Provider    Answer:  MILLER, BRIAN [[id number]]  . ondansetron (ZOFRAN ODT) 4 MG disintegrating tablet    Sig: Take 1 tablet (4 mg total) by mouth every 8 (eight) hours as needed for nausea or vomiting.    Dispense:  20 tablet    Refill:  0    Order Specific Question:  Supervising Provider    Answer:  MILLER, BRIAN [[id number]]  . HYDROcodone-acetaminophen (NORCO/VICODIN) 5-325 MG tablet    Sig: Take 2 tablets by mouth every 4 (four) hours as needed.    Dispense:  16 tablet    Refill:  0    Order Specific Question:  Supervising Provider    Answer:  Eber HongMILLER, BRIAN [[id number]]    Lonia SkinnerLeslie K Heber-OvergaardSofia, PA-C 05/10/16 1056  Margarita Grizzleanielle Ray, MD 05/11/16 1118

## 2016-08-13 NOTE — Progress Notes (Signed)
Pt has preop appt on 08/16/2016; please place surgical orders in epic. Thanks.  

## 2016-08-14 ENCOUNTER — Ambulatory Visit: Payer: Self-pay | Admitting: General Surgery

## 2016-08-15 ENCOUNTER — Encounter (HOSPITAL_COMMUNITY): Payer: Self-pay

## 2016-08-15 NOTE — Patient Instructions (Signed)
Dana Edwards  08/15/2016   Your procedure is scheduled on: 08-22-16  [facility]  [facility] to 3rd floor to  Short Stay Center at   0530 AM.  Call this number if you have problems the morning of surgery [phone]   Remember: ONLY 1 PERSON MAY GO WITH YOU TO SHORT STAY TO GET  READY MORNING OF YOUR SURGERY.  Do not eat food or drink liquids :After Midnight.     Take these medicines the morning of surgery with A SIP OF WATER:  DO NOT TAKE ANY DIABETIC MEDICATIONS DAY OF YOUR SURGERY                               You may not have any metal on your body including hair pins and              piercings  Do not wear jewelry, make-up, lotions, powders or perfumes, deodorant             Do not wear nail polish.  Do not shave  48 hours prior to surgery.              Men may shave face and neck.   Do not bring valuables to the hospital. [facility] IS NOT             RESPONSIBLE   FOR VALUABLES.  Contacts, dentures or bridgework may not be worn into surgery.  Leave suitcase in the car. After surgery it may be brought to your room.     Patients discharged the day of surgery will not be allowed to drive home.  Name and phone number of your driver:  Special Instructions: N/A              Please read over the following fact sheets you were given: _____________________________________________________________________             [facility] Pre[facility] Before surgery, you can play an important role.  Because skin is not sterile, your skin needs to be as free of germs as possible.  You can reduce the number of germs on your skin by washing with CHG (chlorahexidine gluconate) soap before surgery.  CHG is an antiseptic cleaner which kills germs and bonds with the skin to continue killing germs even after washing. Please DO NOT use if you have an allergy to CHG or antibacterial soaps.  If your skin becomes reddened/irritated  stop using the CHG and inform your nurse when you arrive at Short Stay. Do not shave (including legs and underarms) for at least 48 hours prior to the first CHG shower.  You may shave your face/neck. Please follow these instructions carefully:  1.  Shower with CHG Soap the night before surgery and the  morning of Surgery.  2.  If you choose to wash your hair, wash your hair first as usual with your  normal  shampoo.  3.  After you shampoo, rinse your hair and body thoroughly to remove the  shampoo.                           4.  Use CHG as you would any other liquid soap.  You can apply chg directly  to the skin and wash  Gently with a scrungie or clean washcloth.  5.  Apply the CHG Soap to your body ONLY FROM THE NECK DOWN.   Do not use on face/ open                           Wound or open sores. Avoid contact with eyes, ears mouth and genitals (private parts).                       Wash face,  Genitals (private parts) with your normal soap.             6.  Wash thoroughly, paying special attention to the area where your surgery  will be performed.  7.  Thoroughly rinse your body with warm water from the neck down.  8.  DO NOT shower/wash with your normal soap after using and rinsing off  the CHG Soap.                9.  Pat yourself dry with a clean towel.            10.  Wear clean pajamas.            11.  Place clean sheets on your bed the night of your first shower and do not  sleep with pets. Day of Surgery : Do not apply any lotions/deodorants the morning of surgery.  Please wear clean clothes to the hospital/surgery center.  FAILURE TO FOLLOW THESE INSTRUCTIONS MAY RESULT IN THE CANCELLATION OF YOUR SURGERY PATIENT SIGNATURE_________________________________  NURSE SIGNATURE__________________________________  ________________________________________________________________________

## 2016-08-16 ENCOUNTER — Encounter (HOSPITAL_COMMUNITY)
Admission: RE | Admit: 2016-08-16 | Discharge: 2016-08-16 | Disposition: A | Discharge status: Home / Self Care | Payer: Medicaid Other | Source: Ambulatory Visit | Attending: General Surgery | Admitting: General Surgery

## 2016-08-18 ENCOUNTER — Emergency Department (HOSPITAL_COMMUNITY): Payer: Medicaid Other

## 2016-08-18 ENCOUNTER — Emergency Department (HOSPITAL_COMMUNITY)
Admission: EM | Admit: 2016-08-18 | Discharge: 2016-08-18 | Disposition: A | Discharge status: Home / Self Care | Payer: Medicaid Other | Attending: Emergency Medicine | Admitting: Emergency Medicine

## 2016-08-18 DIAGNOSIS — Z79899 Other long term (current) drug therapy: Secondary | ICD-10-CM | POA: Insufficient documentation

## 2016-08-18 DIAGNOSIS — R112 Nausea with vomiting, unspecified: Secondary | ICD-10-CM

## 2016-08-18 DIAGNOSIS — R1011 Right upper quadrant pain: Secondary | ICD-10-CM

## 2016-08-18 DIAGNOSIS — K8020 Calculus of gallbladder without cholecystitis without obstruction: Secondary | ICD-10-CM

## 2016-08-18 DIAGNOSIS — Z7982 Long term (current) use of aspirin: Secondary | ICD-10-CM | POA: Insufficient documentation

## 2016-08-18 LAB — LIPASE, BLOOD: Lipase: 20 U/L (ref 11–51)

## 2016-08-18 LAB — URINALYSIS, ROUTINE W REFLEX MICROSCOPIC
BILIRUBIN URINE: NEGATIVE
GLUCOSE, UA: NEGATIVE mg/dL
HGB URINE DIPSTICK: NEGATIVE
KETONES UR: NEGATIVE mg/dL
Nitrite: NEGATIVE
PROTEIN: NEGATIVE mg/dL
Specific Gravity, Urine: 1.011 (ref 1.005–1.030)
pH: 6 (ref 5.0–8.0)

## 2016-08-18 LAB — COMPREHENSIVE METABOLIC PANEL
ALBUMIN: 4.2 g/dL (ref 3.5–5.0)
ALT: 13 U/L — ABNORMAL LOW (ref 14–54)
ANION GAP: 7 (ref 5–15)
AST: 18 U/L (ref 15–41)
Alkaline Phosphatase: 52 U/L (ref 38–126)
BILIRUBIN TOTAL: 0.4 mg/dL (ref 0.3–1.2)
BUN: 9 mg/dL (ref 6–20)
CALCIUM: 9.3 mg/dL (ref 8.9–10.3)
CO2: 28 mmol/L (ref 22–32)
Chloride: 103 mmol/L (ref 101–111)
Creatinine, Ser: 0.79 mg/dL (ref 0.44–1.00)
GFR calc non Af Amer: >60 mL/min (ref >60)
GLUCOSE: 83 mg/dL (ref 65–99)
POTASSIUM: 4.1 mmol/L (ref 3.5–5.1)
SODIUM: 138 mmol/L (ref 135–145)
Total Protein: 8.3 g/dL — ABNORMAL HIGH (ref 6.5–8.1)

## 2016-08-18 LAB — I-STAT BETA HCG BLOOD, ED (MC, WL, AP ONLY): I-stat hCG, quantitative: <5 m[IU]/mL (ref <5)

## 2016-08-18 LAB — CBC
HCT: 36.5 % (ref 36.0–46.0)
Hemoglobin: 11.8 g/dL — ABNORMAL LOW (ref 12.0–15.0)
MCH: 29.2 pg (ref 26.0–34.0)
MCHC: 32.3 g/dL (ref 30.0–36.0)
MCV: 90.3 fL (ref 78.0–100.0)
PLATELETS: 164 10*3/uL (ref 150–400)
RBC: 4.04 MIL/uL (ref 3.87–5.11)
RDW: 13.6 % (ref 11.5–15.5)
WBC: 10.2 10*3/uL (ref 4.0–10.5)

## 2016-08-18 LAB — URINE MICROSCOPIC-ADD ON

## 2016-08-18 MED ORDER — KETOROLAC TROMETHAMINE 15 MG/ML IJ SOLN
15.0000 mg | Freq: Once | INTRAMUSCULAR | Status: AC
  Administered 2016-08-18: 15 mg via INTRAVENOUS
  Filled 2016-08-18: qty 1

## 2016-08-18 MED ORDER — NAPROXEN 500 MG PO TABS: 500.0000 mg | ORAL_TABLET | Freq: Two times a day (BID) | ORAL | 0 refills | Status: DC

## 2016-08-18 MED ORDER — SODIUM CHLORIDE 0.9 % IV BOLUS (SEPSIS)
1000.0000 mL | Freq: Once | INTRAVENOUS | Status: AC
  Administered 2016-08-18: 1000 mL via INTRAVENOUS

## 2016-08-18 MED ORDER — ONDANSETRON HCL 4 MG/2ML IJ SOLN
4.0000 mg | Freq: Once | INTRAMUSCULAR | Status: AC
  Administered 2016-08-18: 4 mg via INTRAVENOUS
  Filled 2016-08-18: qty 2

## 2016-08-18 MED ORDER — HYDROCODONE-ACETAMINOPHEN 5-325 MG PO TABS: 1.0000 | ORAL_TABLET | ORAL | 0 refills | Status: DC | PRN

## 2016-08-18 MED ORDER — HYDROCODONE-ACETAMINOPHEN 5-325 MG PO TABS
1.0000 | ORAL_TABLET | Freq: Once | ORAL | Status: AC
  Administered 2016-08-18: 1 via ORAL
  Filled 2016-08-18: qty 1

## 2016-08-18 MED ORDER — HYDROMORPHONE HCL 1 MG/ML IJ SOLN
1.0000 mg | Freq: Once | INTRAMUSCULAR | Status: AC
  Administered 2016-08-18: 1 mg via INTRAVENOUS
  Filled 2016-08-18: qty 1

## 2016-08-18 NOTE — ED Provider Notes (Signed)
[facility]-EMERGENCY DEPT Provider Note   CSN: [id number] Arrival date & time: 08/18/16  1630     History   Chief Complaint Chief Complaint  Patient presents with  . Abdominal Pain    rRLQ  . Nausea  . Emesis    HPI MIKITA LESMEISTER is a 30 y.o. female.  HPI Erikah Thumm Courts is a 30 y.o. female BIB EMS with PMH significant for cholelithiasis who presents with sudden onset, constant, severe, sharp, aching right-sided abdominal pain that began approximately 30 minutes ago. She was found hyperventilating on scene.  States this feels similar to her "gallstone attack "in June. She ran out of prescribed Vicodin.  She states she is supposed to have surgery at the end of this month. Associated symptoms include nausea and vomiting. Denies fever, chills, chest pain, diarrhea, constipation, bloody stools, vaginal discharge, or urinary symptoms. She denies any alcohol use. Denies tobacco use.  Is worse with palpation.  Past Medical History:  Diagnosis Date  . Medical history non-contributory     Patient Active Problem List   Diagnosis Date Noted  . Spontaneous onset of labor after 37 but before 39 completed weeks gestation with delivery by planned cesarean section 09/21/2015  . Active labor at term 09/21/2015  . VBAC (vaginal birth after Cesarean) 09/21/2015  . Evaluate anatomy not seen on prior sonogram   . [redacted] weeks gestation of pregnancy   . [redacted] weeks gestation of pregnancy 04/26/2015  . Pregnancy related abdominal pain of lower quadrant, antepartum   . Encounter for fetal anatomic survey     Past Surgical History:  Procedure Laterality Date  . CESAREAN SECTION      OB History    Gravida Para Term Preterm AB Living   3 3 3  0 0 3   SAB TAB Ectopic Multiple Live Births   0 0 0 0 3       Home Medications    Prior to Admission medications   Medication Sig Start Date End Date Taking? Authorizing Provider  aspirin 325 MG tablet Take 650 mg by mouth once.    Historical Provider, MD   famotidine (PEPCID) 20 MG tablet Take 1 tablet (20 mg total) by mouth 2 (two) times daily. 05/10/16   Elson Areas, PA-C  HYDROcodone-acetaminophen (NORCO/VICODIN) 5-325 MG tablet Take 1-2 tablets by mouth every 4 (four) hours as needed. 08/18/16   Cheri Fowler, PA-C  naproxen (NAPROSYN) 500 MG tablet Take 1 tablet (500 mg total) by mouth 2 (two) times daily. 08/18/16   Claryce Friel, PA-C  ondansetron (ZOFRAN ODT) 4 MG disintegrating tablet Take 1 tablet (4 mg total) by mouth every 8 (eight) hours as needed for nausea or vomiting. 05/10/16   Elson Areas, PA-C    Family History No family history on file.  Social History Social History  Substance Use Topics  . Smoking status: Never Smoker  . Smokeless tobacco: Never Used  . Alcohol use No     Allergies   Review of patient's allergies indicates no known allergies.   Review of Systems Review of Systems All other systems negative unless otherwise stated in HPI   Physical Exam Updated Vital Signs BP 110/74   Pulse 62   Temp 98.2 F (36.8 C) (Oral)   Resp 18   SpO2 99%   Physical Exam  Constitutional: She is oriented to person, place, and time. She appears well-developed and well-nourished.  Non-toxic appearance. She does not have a sickly appearance. She does not appear  ill.  Appears uncomfortable.   HENT:  Head: Normocephalic and atraumatic.  Mouth/Throat: Oropharynx is clear and moist.  Eyes: Conjunctivae are normal. Pupils are equal, round, and reactive to light.  Neck: Normal range of motion. Neck supple.  Cardiovascular: Normal rate and regular rhythm.   Pulmonary/Chest: Effort normal and breath sounds normal. No accessory muscle usage or stridor. No respiratory distress. She has no wheezes. She has no rhonchi. She has no rales.  Abdominal: Soft. Bowel sounds are normal. She exhibits no distension. There is tenderness in the right upper quadrant and epigastric area. There is positive Murphy's sign. There is no rebound and no  guarding.    Musculoskeletal: Normal range of motion.  Lymphadenopathy:    She has no cervical adenopathy.  Neurological: She is alert and oriented to person, place, and time.  Speech clear without dysarthria.  Skin: Skin is warm and dry.  Psychiatric: She has a normal mood and affect. Her behavior is normal.     ED Treatments / Results  Labs (all labs ordered are listed, but only abnormal results are displayed) Labs Reviewed  COMPREHENSIVE METABOLIC PANEL - Abnormal; Notable for the following:       Result Value   Total Protein 8.3 (*)    ALT 13 (*)    All other components within normal limits  CBC - Abnormal; Notable for the following:    Hemoglobin 11.8 (*)    All other components within normal limits  URINALYSIS, ROUTINE W REFLEX MICROSCOPIC (NOT AT [facility]) - Abnormal; Notable for the following:    Leukocytes, UA SMALL (*)    All other components within normal limits  URINE MICROSCOPIC-ADD ON - Abnormal; Notable for the following:    Squamous Epithelial / LPF 0-5 (*)    Bacteria, UA RARE (*)    All other components within normal limits  LIPASE, BLOOD  I-STAT BETA HCG BLOOD, ED ([facility], [facility], [facility] ONLY)    EKG  EKG Interpretation None       Radiology US Abdomen Limited Ruq  Result Date: 08/18/2016 CLINICAL DATA:  Right upper quadrant pain EXAM: US ABDOMEN LIMITED - RIGHT UPPER QUADRANT COMPARISON:  None. FINDINGS: Gallbladder: Contracted gallbladder. Cholelithiasis. No pericholecystic fluid. Mild relative gallbladder wall thickening likely secondary to contracted state. No sonographic Murphy sign noted by sonographer. Common bile duct: Diameter: 6 mm Liver: No focal lesion identified. Within normal limits in parenchymal echogenicity. IMPRESSION: Cholelithiasis without sonographic evidence of acute cholecystitis. Electronically Signed   By: Elige Ko   On: 08/18/2016 17:49    Procedures Procedures (including critical care time)  Medications Ordered in ED Medications    sodium chloride 0.9 % bolus 1,000 mL (0 mLs Intravenous Stopped 08/18/16 2009)  HYDROmorphone (DILAUDID) injection 1 mg (1 mg Intravenous Given 08/18/16 1750)  ondansetron (ZOFRAN) injection 4 mg (4 mg Intravenous Given 08/18/16 1749)  ketorolac (TORADOL) 15 MG/ML injection 15 mg (15 mg Intravenous Given 08/18/16 2007)     Initial Impression / Assessment and Plan / ED Course  I have reviewed the triage vital signs and the nursing notes.  Pertinent labs & imaging results that were available during my care of the patient were reviewed by me and considered in my medical decision making (see chart for details).  Clinical Course   Patient presents with sudden onset right sided abdominal pain.  Hx of cholelithiasis.  Associated with nausea and vomiting.  No fever.  VSS, NAD, although appears uncomfortable.  RUQ and epigastric tenderness without rebound, guarding,  or rigidity.  DDx include cholecystitis, choledocholithiasis, pancreatitis, gastritis.  Will obtain labs, give fluids, symptom control, and obtain ultrasound. Labs without acute abnormalities, normal LFTs.  Normal ultrasound, no cholecystitis or CBD dilatation.  Patient has surgery scheduled for 08/22/16.  Able to tolerate PO without difficulty.  Reviewed [facility] and last narcotic prescription 07/16/16 for 10 day supply.  Will give short course, discussed surgery follow up for further refills and surgery.  Plan to discharge home with symptom control and surgery follow up.  Return precautions discussed.  Stable for discharge.   Final Clinical Impressions(s) / ED Diagnoses   Final diagnoses:  RUQ pain  Gallstones  Non-intractable vomiting with nausea, vomiting of unspecified type    New Prescriptions New Prescriptions   HYDROCODONE-ACETAMINOPHEN (NORCO/VICODIN) 5-325 MG TABLET    Take 1-2 tablets by mouth every 4 (four) hours as needed.   NAPROXEN (NAPROSYN) 500 MG TABLET    Take 1 tablet (500 mg total) by mouth 2 (two) times daily.      Cheri FowlerKayla Jaquavis Felmlee, PA-C 08/18/16 2034    Mancel BaleElliott Wentz, MD 08/19/16 1946

## 2016-08-18 NOTE — ED Triage Notes (Signed)
Per [facility]- Pt with HX of Gallstones. Surgical consult recommended surgery. Pt declined in July. Last RX refill for pain medication recent. Ran out of pain medication. Requested EMS to refill her pain medication. Zofran 4mg  IVP given in route. Denies fever. C/o of SOB (hyperventilating on scene). Removed NRB. Pt with full recovery in route. Pt pat present on RA at 98%.

## 2016-08-18 NOTE — ED Notes (Signed)
Bed: [id number] Expected date:  Expected time:  Means of arrival:  Comments: 30 yo RLQ pain, hx of gallstones

## 2016-08-18 NOTE — ED Notes (Signed)
Ultrasound at bedside

## 2016-08-18 NOTE — Discharge Instructions (Signed)
Your pain is likely due to your gallstones.  Your labs and ultrasound today do not show any abnormalities.  1. Continue home medications. 2. Start taking Norco every 4 hours as needed for pain.  Also start taking Naproxen twice daily for pain. 3.  Call your surgeon tomorrow.  Go to your scheduled surgery August 22, 2016.

## 2016-08-26 ENCOUNTER — Other Ambulatory Visit (HOSPITAL_COMMUNITY): Payer: Medicaid Other

## 2016-09-19 ENCOUNTER — Encounter (HOSPITAL_COMMUNITY): Payer: Self-pay | Admitting: *Deleted

## 2016-09-19 ENCOUNTER — Inpatient Hospital Stay (HOSPITAL_COMMUNITY)
Admission: AD | Admit: 2016-09-19 | Discharge: 2016-09-19 | Disposition: A | Discharge status: Home / Self Care | Payer: Medicaid Other | Source: Ambulatory Visit | Attending: Obstetrics & Gynecology | Admitting: Obstetrics & Gynecology

## 2016-09-19 DIAGNOSIS — N3000 Acute cystitis without hematuria: Secondary | ICD-10-CM | POA: Diagnosis not present

## 2016-09-19 DIAGNOSIS — K8020 Calculus of gallbladder without cholecystitis without obstruction: Secondary | ICD-10-CM | POA: Insufficient documentation

## 2016-09-19 DIAGNOSIS — R109 Unspecified abdominal pain: Secondary | ICD-10-CM | POA: Diagnosis present

## 2016-09-19 HISTORY — DX: Calculus of gallbladder without cholecystitis without obstruction: K80.20

## 2016-09-19 LAB — URINALYSIS, ROUTINE W REFLEX MICROSCOPIC
BILIRUBIN URINE: NEGATIVE
Glucose, UA: NEGATIVE mg/dL
Hgb urine dipstick: NEGATIVE
KETONES UR: NEGATIVE mg/dL
Leukocytes, UA: NEGATIVE
Nitrite: POSITIVE — AB
PH: 5.5 (ref 5.0–8.0)
Protein, ur: NEGATIVE mg/dL
Specific Gravity, Urine: <1.005 — ABNORMAL LOW (ref 1.005–1.030)

## 2016-09-19 LAB — AMYLASE: AMYLASE: 74 U/L (ref 28–100)

## 2016-09-19 LAB — COMPREHENSIVE METABOLIC PANEL
ALT: 10 U/L — AB (ref 14–54)
AST: 12 U/L — AB (ref 15–41)
Albumin: 4.1 g/dL (ref 3.5–5.0)
Alkaline Phosphatase: 51 U/L (ref 38–126)
Anion gap: 7 (ref 5–15)
BILIRUBIN TOTAL: 0.5 mg/dL (ref 0.3–1.2)
BUN: 12 mg/dL (ref 6–20)
CO2: 25 mmol/L (ref 22–32)
CREATININE: 0.74 mg/dL (ref 0.44–1.00)
Calcium: 8.8 mg/dL — ABNORMAL LOW (ref 8.9–10.3)
Chloride: 103 mmol/L (ref 101–111)
GFR calc Af Amer: >60 mL/min (ref >60)
Glucose, Bld: 84 mg/dL (ref 65–99)
Potassium: 3.9 mmol/L (ref 3.5–5.1)
Sodium: 135 mmol/L (ref 135–145)
Total Protein: 8.1 g/dL (ref 6.5–8.1)

## 2016-09-19 LAB — CBC
HEMATOCRIT: 36.2 % (ref 36.0–46.0)
Hemoglobin: 12.2 g/dL (ref 12.0–15.0)
MCH: 29.8 pg (ref 26.0–34.0)
MCHC: 33.7 g/dL (ref 30.0–36.0)
MCV: 88.3 fL (ref 78.0–100.0)
Platelets: 318 10*3/uL (ref 150–400)
RBC: 4.1 MIL/uL (ref 3.87–5.11)
RDW: 13.9 % (ref 11.5–15.5)
WBC: 8.6 10*3/uL (ref 4.0–10.5)

## 2016-09-19 LAB — URINE MICROSCOPIC-ADD ON
RBC / HPF: NONE SEEN RBC/hpf (ref 0–5)
Squamous Epithelial / LPF: NONE SEEN
WBC, UA: NONE SEEN WBC/hpf (ref 0–5)

## 2016-09-19 LAB — POCT PREGNANCY, URINE: Preg Test, Ur: NEGATIVE

## 2016-09-19 LAB — LIPASE, BLOOD: Lipase: 20 U/L (ref 11–51)

## 2016-09-19 MED ORDER — ONDANSETRON 4 MG PO TBDP: 4.0000 mg | ORAL_TABLET | Freq: Three times a day (TID) | ORAL | 0 refills | Status: DC | PRN

## 2016-09-19 MED ORDER — HYDROMORPHONE HCL 1 MG/ML IJ SOLN
1.0000 mg | Freq: Once | INTRAMUSCULAR | Status: AC
  Administered 2016-09-19: 1 mg via INTRAVENOUS
  Filled 2016-09-19: qty 1

## 2016-09-19 MED ORDER — CEFTRIAXONE SODIUM 1 G IJ SOLR
1.0000 g | Freq: Once | INTRAMUSCULAR | Status: AC
  Administered 2016-09-19: 1 g via INTRAVENOUS
  Filled 2016-09-19: qty 10

## 2016-09-19 MED ORDER — ONDANSETRON HCL 4 MG/2ML IJ SOLN
4.0000 mg | Freq: Once | INTRAMUSCULAR | Status: AC
  Administered 2016-09-19: 4 mg via INTRAVENOUS
  Filled 2016-09-19: qty 2

## 2016-09-19 MED ORDER — NAPROXEN 500 MG PO TABS: 500.0000 mg | ORAL_TABLET | Freq: Two times a day (BID) | ORAL | 0 refills | Status: DC

## 2016-09-19 MED ORDER — SODIUM CHLORIDE 0.9 % IV SOLN
INTRAVENOUS | Status: DC
  Administered 2016-09-19: 20:00:00 via INTRAVENOUS

## 2016-09-19 MED ORDER — HYDROCODONE-ACETAMINOPHEN 5-325 MG PO TABS: 1.0000 | ORAL_TABLET | ORAL | 0 refills | Status: DC | PRN

## 2016-09-19 MED ORDER — LACTATED RINGERS IV BOLUS (SEPSIS)
1000.0000 mL | Freq: Once | INTRAVENOUS | Status: DC
  Administered 2016-09-19: 1000 mL via INTRAVENOUS

## 2016-09-19 NOTE — MAU Note (Signed)
Pt presents to MAU with complaints of nausea and vomiting for 4 days. Pt states she is scheduled to have her gallbladder removed on Nov 2nd but the pain is getting too bad to wait. Denies any vaginal bleeding or abnormal discharge

## 2016-09-19 NOTE — Discharge Instructions (Signed)
Cholelithiasis °Cholelithiasis (also called gallstones) is a form of gallbladder disease in which gallstones form in your gallbladder. The gallbladder is an organ that stores bile made in the liver, which helps digest fats. Gallstones begin as small crystals and slowly grow into stones. Gallstone pain occurs when the gallbladder spasms and a gallstone is blocking the duct. Pain can also occur when a stone passes out of the duct.  °RISK FACTORS °· Being female.   °· Having multiple pregnancies. Health care providers sometimes advise removing diseased gallbladders before future pregnancies.   °· Being obese. °· Eating a diet heavy in fried foods and fat.   °· Being older than 60 years and increasing age.   °· Prolonged use of medicines containing female hormones.   °· Having diabetes mellitus.   °· Rapidly losing weight.   °· Having a family history of gallstones (heredity).   °SYMPTOMS °· Nausea.   °· Vomiting. °· Abdominal pain.   °· Yellowing of the skin (jaundice).   °· Sudden pain. It may persist from several minutes to several hours. °· Fever.   °· Tenderness to the touch.  °In some cases, when gallstones do not move into the bile duct, people have no pain or symptoms. These are called "silent" gallstones.  °TREATMENT °Silent gallstones do not need treatment. In severe cases, emergency surgery may be required. Options for treatment include: °· Surgery to remove the gallbladder. This is the most common treatment. °· Medicines. These do not always work and may take 6-12 months or more to work. °· Shock wave treatment (extracorporeal biliary lithotripsy). In this treatment an ultrasound machine sends shock waves to the gallbladder to break gallstones into smaller pieces that can pass into the intestines or be dissolved by medicine. °HOME CARE INSTRUCTIONS  °· Only take over-the-counter or prescription medicines for pain, discomfort, or fever as directed by your health care provider.   °· Follow a low-fat diet until  seen again by your health care provider. Fat causes the gallbladder to contract, which can result in pain.   °· Follow up with your health care provider as directed. Attacks are almost always recurrent and surgery is usually required for permanent treatment.   °SEEK IMMEDIATE MEDICAL CARE IF:  °· Your pain increases and is not controlled by medicines.   °· You have a fever or persistent symptoms for more than 2-3 days.   °· You have a fever and your symptoms suddenly get worse.   °· You have persistent nausea and vomiting.   °MAKE SURE YOU:  °· Understand these instructions. °· Will watch your condition. °· Will get help right away if you are not doing well or get worse. °  °This information is not intended to replace advice given to you by your health care provider. Make sure you discuss any questions you have with your health care provider. °  °Document Released: 11/21/2005 Document Revised: 07/28/2013 Document Reviewed: 05/19/2013 °Elsevier Interactive Patient Education ©2016 [employer] ° °Urinary Tract Infection °Urinary tract infections (UTIs) can develop anywhere along your urinary tract. Your urinary tract is your body's drainage system for removing wastes and extra water. Your urinary tract includes two kidneys, two ureters, a bladder, and a urethra. Your kidneys are a pair of bean-shaped organs. Each kidney is about the size of your fist. They are located below your ribs, one on each side of your spine. °CAUSES °Infections are caused by microbes, which are microscopic organisms, including fungi, viruses, and bacteria. These organisms are so small that they can only be seen through a microscope. Bacteria are the microbes that most commonly cause   UTIs. °SYMPTOMS  °Symptoms of UTIs may vary by age and gender of the patient and by the location of the infection. Symptoms in young women typically include a frequent and intense urge to urinate and a painful, burning feeling in the bladder or urethra during  urination. Older women and men are more likely to be tired, shaky, and weak and have muscle aches and abdominal pain. A fever may mean the infection is in your kidneys. Other symptoms of a kidney infection include pain in your back or sides below the ribs, nausea, and vomiting. °DIAGNOSIS °To diagnose a UTI, your caregiver will ask you about your symptoms. Your caregiver will also ask you to provide a urine sample. The urine sample will be tested for bacteria and white blood cells. White blood cells are made by your body to help fight infection. °TREATMENT  °Typically, UTIs can be treated with medication. Because most UTIs are caused by a bacterial infection, they usually can be treated with the use of antibiotics. The choice of antibiotic and length of treatment depend on your symptoms and the type of bacteria causing your infection. °HOME CARE INSTRUCTIONS °· If you were prescribed antibiotics, take them exactly as your caregiver instructs you. Finish the medication even if you feel better after you have only taken some of the medication. °· Drink enough water and fluids to keep your urine clear or pale yellow. °· Avoid caffeine, tea, and carbonated beverages. They tend to irritate your bladder. °· Empty your bladder often. Avoid holding urine for long periods of time. °· Empty your bladder before and after sexual intercourse. °· After a bowel movement, women should cleanse from front to back. Use each tissue only once. °SEEK MEDICAL CARE IF:  °· You have back pain. °· You develop a fever. °· Your symptoms do not begin to resolve within 3 days. °SEEK IMMEDIATE MEDICAL CARE IF:  °· You have severe back pain or lower abdominal pain. °· You develop chills. °· You have nausea or vomiting. °· You have continued burning or discomfort with urination. °MAKE SURE YOU:  °· Understand these instructions. °· Will watch your condition. °· Will get help right away if you are not doing well or get worse. °  °This information is  not intended to replace advice given to you by your health care provider. Make sure you discuss any questions you have with your health care provider. °  °Document Released: 09/04/2005 Document Revised: 08/16/2015 Document Reviewed: 01/03/2012 °Elsevier Interactive Patient Education ©2016 Elsevier Inc. ° °

## 2016-09-19 NOTE — MAU Provider Note (Signed)
History     CSN: [id number]  Arrival date and time: 09/19/16 7829   First Provider Initiated Contact with Patient 09/19/16 1730      Chief Complaint  Patient presents with  . Nausea  . Abdominal Pain   HPI Dana Edwards is a 30 y.o. female who presents for abdominal pain & n/v. Pt is not pregnant. Pt is scheduled for a cholecystectomy on 11/2 d/t gallstones. Her surgeon is Dr. Andrey Campanile, whom she attempted to contact today d/t pain but had received a call back. Pt was previously managing pain with vicodin & naproxen but ran out of her meds 2 weeks ago. States pain has worsened over the last 2 days. Rates pain 8/10. Has not treated pain today. Nothing makes better or worse. Pain is throughout mid to upper abdomen but worse in RUQ. Has not been able keep down food or fluids today or yesterday.    Past Medical History:  Diagnosis Date  . Cholelithiasis     Past Surgical History:  Procedure Laterality Date  . CESAREAN SECTION      History reviewed. No pertinent family history.  Social History  Substance Use Topics  . Smoking status: Never Smoker  . Smokeless tobacco: Never Used  . Alcohol use No    Allergies: No Known Allergies  Prescriptions Prior to Admission  Medication Sig Dispense Refill Last Dose  . acetaminophen (TYLENOL) 500 MG tablet Take 1,000 mg by mouth every 6 (six) hours as needed for mild pain or moderate pain.   09/19/2016 at Unknown time  . naproxen (NAPROSYN) 500 MG tablet Take 1 tablet (500 mg total) by mouth 2 (two) times daily. 30 tablet 0 Past Month at Unknown time  . famotidine (PEPCID) 20 MG tablet Take 1 tablet (20 mg total) by mouth 2 (two) times daily. (Patient not taking: Reported on 09/19/2016) 30 tablet 0 Not Taking at Unknown time  . HYDROcodone-acetaminophen (NORCO/VICODIN) 5-325 MG tablet Take 1-2 tablets by mouth every 4 (four) hours as needed. (Patient not taking: Reported on 09/19/2016) 12 tablet 0 Not Taking at Unknown time  . ondansetron  (ZOFRAN ODT) 4 MG disintegrating tablet Take 1 tablet (4 mg total) by mouth every 8 (eight) hours as needed for nausea or vomiting. (Patient not taking: Reported on 09/19/2016) 20 tablet 0 Not Taking at Unknown time    Review of Systems  Constitutional: Negative for chills and fever.  Gastrointestinal: Positive for abdominal pain, nausea and vomiting. Negative for constipation and diarrhea.  Genitourinary: Negative.    Physical Exam   Blood pressure 124/84, pulse 80, temperature 99 F (37.2 C), resp. rate 18, last menstrual period 09/04/2016, not currently breastfeeding.  Physical Exam  Nursing note and vitals reviewed. Constitutional: She is oriented to person, place, and time. She appears well-developed and well-nourished. She appears distressed (pt tearful).  HENT:  Head: Normocephalic and atraumatic.  Eyes: Conjunctivae are normal. Right eye exhibits no discharge. Left eye exhibits no discharge. No scleral icterus.  Neck: Normal range of motion.  Cardiovascular: Normal rate, regular rhythm and normal heart sounds.   No murmur heard. Respiratory: Effort normal and breath sounds normal. No respiratory distress. She has no wheezes.  GI: Soft. Bowel sounds are normal. She exhibits no distension. There is tenderness. There is positive Murphy's sign. There is no rebound and no guarding.  Neurological: She is alert and oriented to person, place, and time.  Skin: Skin is warm and dry. She is not diaphoretic.  Psychiatric: She has a  normal mood and affect. Her behavior is normal. Judgment and thought content normal.    MAU Course  Procedures Results for orders placed or performed during the hospital encounter of 09/19/16 (from the past 24 hour(s))  Urinalysis, Routine w reflex microscopic (not at [facility])     Status: Abnormal   Collection Time: 09/19/16  4:45 PM  Result Value Ref Range   Color, Urine YELLOW YELLOW   APPearance CLEAR CLEAR   Specific Gravity, Urine <1.005 (L) 1.005 -  1.030   pH 5.5 5.0 - 8.0   Glucose, UA NEGATIVE NEGATIVE mg/dL   Hgb urine dipstick NEGATIVE NEGATIVE   Bilirubin Urine NEGATIVE NEGATIVE   Ketones, ur NEGATIVE NEGATIVE mg/dL   Protein, ur NEGATIVE NEGATIVE mg/dL   Nitrite POSITIVE (A) NEGATIVE   Leukocytes, UA NEGATIVE NEGATIVE  Urine microscopic-add on     Status: Abnormal   Collection Time: 09/19/16  4:45 PM  Result Value Ref Range   Squamous Epithelial / LPF NONE SEEN NONE SEEN   WBC, UA NONE SEEN 0 - 5 WBC/hpf   RBC / HPF NONE SEEN 0 - 5 RBC/hpf   Bacteria, UA RARE (A) NONE SEEN  Pregnancy, urine POC     Status: None   Collection Time: 09/19/16  5:18 PM  Result Value Ref Range   Preg Test, Ur NEGATIVE NEGATIVE  CBC     Status: None   Collection Time: 09/19/16  5:44 PM  Result Value Ref Range   WBC 8.6 4.0 - 10.5 K/uL   RBC 4.10 3.87 - 5.11 MIL/uL   Hemoglobin 12.2 12.0 - 15.0 g/dL   HCT 54.0 98.1 - 19.1 %   MCV 88.3 78.0 - 100.0 fL   MCH 29.8 26.0 - 34.0 pg   MCHC 33.7 30.0 - 36.0 g/dL   RDW 47.8 29.5 - 62.1 %   Platelets 318 150 - 400 K/uL  Comprehensive metabolic panel     Status: Abnormal   Collection Time: 09/19/16  5:44 PM  Result Value Ref Range   Sodium 135 135 - 145 mmol/L   Potassium 3.9 3.5 - 5.1 mmol/L   Chloride 103 101 - 111 mmol/L   CO2 25 22 - 32 mmol/L   Glucose, Bld 84 65 - 99 mg/dL   BUN 12 6 - 20 mg/dL   Creatinine, Ser 3.08 0.44 - 1.00 mg/dL   Calcium 8.8 (L) 8.9 - 10.3 mg/dL   Total Protein 8.1 6.5 - 8.1 g/dL   Albumin 4.1 3.5 - 5.0 g/dL   AST 12 (L) 15 - 41 U/L   ALT 10 (L) 14 - 54 U/L   Alkaline Phosphatase 51 38 - 126 U/L   Total Bilirubin 0.5 0.3 - 1.2 mg/dL   GFR calc non Af Amer >60 >60 mL/min   GFR calc Af Amer >60 >60 mL/min   Anion gap 7 5 - 15  Amylase     Status: None   Collection Time: 09/19/16  5:44 PM  Result Value Ref Range   Amylase 74 28 - 100 U/L  Lipase, blood     Status: None   Collection Time: 09/19/16  5:44 PM  Result Value Ref Range   Lipase 20 11 - 51 U/L     MDM UPT negative CBC, CMP, amylase, lipase IV LR, zofran 4 mg IV, dilaudid 1 mg IV U/z + nitrites; pt doesn't think she'll be able to tolerate oral abx; will give rocephin while here Rocephin 1 gm IV Normal labs; no  indication for repeat imaging tonight. Will refill rx (per review of [place] controlled substance database, pt last narcotic rx 1 month ago)  Assessment and Plan  A: 1. Calculus of gallbladder without cholecystitis without obstruction   2. Cholelithiasis   3. Acute cystitis without hematuria    P: Discharge home Rx vicodin, zofran, naproxen Urine culture pending F/u with surgeon Go to ED if symptoms worsen  Judeth Hornrin Davida Falconi 09/19/2016, 5:30 PM

## 2016-09-22 LAB — URINE CULTURE

## 2016-10-08 NOTE — Patient Instructions (Signed)
Dana Edwards  10/08/2016   Your procedure is scheduled on: 10/17/2016    Report to [facility]  Entrance take [place]  elevators to 3rd floor to  Short Stay Center at   0530 AM.  Call this number if you have problems the morning of surgery [phone]   Remember: ONLY 1 PERSON MAY GO WITH YOU TO SHORT STAY TO GET  READY MORNING OF YOUR SURGERY.  Do not eat food or drink liquids :After Midnight.     Take these medicines the morning of surgery with A SIP OF WATER: Hydrocodone and Zofran if needed                                 You may not have any metal on your body including hair pins and              piercings  Do not wear jewelry, make-up, lotions, powders or perfumes, deodorant             Do not wear nail polish.  Do not shave  48 hours prior to surgery.               Do not bring valuables to the hospital. [facility]  IS NOT             RESPONSIBLE   FOR VALUABLES.  Contacts, dentures or bridgework may not be worn into surgery.  .     Patients discharged the day of surgery will not be allowed to drive home.  Name and phone number of your driver:  Special Instructions: N/A              Please read over the following fact sheets you were given: _____________________________________________________________________             [facility] - Preparing for Surgery Before surgery, you can play an important role.  Because skin is not sterile, your skin needs to be as free of germs as possible.  You can reduce the number of germs on your skin by washing with CHG (chlorahexidine gluconate) soap before surgery.  CHG is an antiseptic cleaner which kills germs and bonds with the skin to continue killing germs even after washing. Please DO NOT use if you have an allergy to CHG or antibacterial soaps.  If your skin becomes reddened/irritated stop using the CHG and inform your nurse when you arrive at Short Stay. Do not shave (including legs and underarms)  for at least 48 hours prior to the first CHG shower.  You may shave your face/neck. Please follow these instructions carefully:  1.  Shower with CHG Soap the night before surgery and the  morning of Surgery.  2.  If you choose to wash your hair, wash your hair first as usual with your  normal  shampoo.  3.  After you shampoo, rinse your hair and body thoroughly to remove the  shampoo.                           4.  Use CHG as you would any other liquid soap.  You can apply chg directly  to the skin and wash  Gently with a scrungie or clean washcloth.  5.  Apply the CHG Soap to your body ONLY FROM THE NECK DOWN.   Do not use on face/ open                           Wound or open sores. Avoid contact with eyes, ears mouth and genitals (private parts).                       Wash face,  Genitals (private parts) with your normal soap.             6.  Wash thoroughly, paying special attention to the area where your surgery  will be performed.  7.  Thoroughly rinse your body with warm water from the neck down.  8.  DO NOT shower/wash with your normal soap after using and rinsing off  the CHG Soap.                9.  Pat yourself dry with a clean towel.            10.  Wear clean pajamas.            11.  Place clean sheets on your bed the night of your first shower and do not  sleep with pets. Day of Surgery : Do not apply any lotions/deodorants the morning of surgery.  Please wear clean clothes to the hospital/surgery center.  FAILURE TO FOLLOW THESE INSTRUCTIONS MAY RESULT IN THE CANCELLATION OF YOUR SURGERY PATIENT SIGNATURE_________________________________  NURSE SIGNATURE__________________________________  ________________________________________________________________________

## 2016-10-10 ENCOUNTER — Encounter (HOSPITAL_COMMUNITY): Payer: Self-pay

## 2016-10-10 ENCOUNTER — Encounter (HOSPITAL_COMMUNITY)
Admission: RE | Admit: 2016-10-10 | Discharge: 2016-10-10 | Disposition: A | Discharge status: Home / Self Care | Payer: Medicaid Other | Source: Ambulatory Visit | Attending: General Surgery | Admitting: General Surgery

## 2016-10-10 DIAGNOSIS — Z01818 Encounter for other preprocedural examination: Secondary | ICD-10-CM | POA: Insufficient documentation

## 2016-10-10 HISTORY — DX: Gastro-esophageal reflux disease without esophagitis: K21.9

## 2016-10-10 HISTORY — DX: Essential (primary) hypertension: I10

## 2016-10-10 LAB — PREGNANCY, URINE: Preg Test, Ur: NEGATIVE

## 2016-10-10 NOTE — Progress Notes (Signed)
EKG-05/11/2016- epic  CXR- 05/10/16- EPIC  09/19/16- CBC, CMP-epic

## 2016-10-16 NOTE — H&P (Signed)
Dana Edwards is an 30 y.o. female.   Chief Complaint: surgery HPI:  Here for surgery for gallbladder. No changes since seen in clinic. Has been having intermittent attacks.   The patient is a 30 year old female who presented  for evaluation of gall stones. She is referred by Dr Margarita Grizzleanielle Ray for evaluation of gallbladder disease. She reports developing Right sided abdominal pain on the evening of 6/2 associated with nausea/vomiting. this prompted her to go to the ED for evaluation. She was found to have normal labs and gallstones on u/s imaging but no evidence of cholecystitis. She reports ongoing mild right sided discomfort but no fever, chills. states she doesn't know what to eat. had similar episodes before June 2 but not as bad. no jaundica/melena/hematochezia/wt loss/gerd. pain does radiate to back  Past Medical History:  Diagnosis Date  . Cholelithiasis   . GERD (gastroesophageal reflux disease)   . Hypertension    hx of 2016 but not on meds now     Past Surgical History:  Procedure Laterality Date  . CESAREAN SECTION  2007    No family history on file. Social History:  reports that she has never smoked. She has never used smokeless tobacco. She reports that she does not drink alcohol or use drugs.  Allergies: No Known Allergies  No prescriptions prior to admission.    No results found for this or any previous visit (from the past 48 hour(s)). No results found.  Review of Systems  Constitutional: Negative for weight loss.  HENT: Negative for nosebleeds.   Eyes: Negative for blurred vision.  Respiratory: Negative for shortness of breath.   Cardiovascular: Negative for chest pain, palpitations, orthopnea and PND.       Denies DOE  Genitourinary: Negative for dysuria and hematuria.  Musculoskeletal: Negative.   Skin: Negative for itching and rash.  Neurological: Negative for dizziness, focal weakness, seizures, loss of consciousness and headaches.       Denies TIAs,  amaurosis fugax  Endo/Heme/Allergies: Does not bruise/bleed easily.  Psychiatric/Behavioral: The patient is not nervous/anxious.     Last menstrual period 09/25/2016, not currently breastfeeding. Physical Exam  Vitals reviewed. Constitutional: She is oriented to person, place, and time. She appears well-developed and well-nourished. No distress.  HENT:  Head: Normocephalic and atraumatic.  Right Ear: External ear normal.  Left Ear: External ear normal.  Eyes: Conjunctivae are normal. No scleral icterus.  Neck: Normal range of motion. Neck supple. No tracheal deviation present. No thyromegaly present.  Cardiovascular: Normal rate and normal heart sounds.   Respiratory: Effort normal and breath sounds normal. No stridor. No respiratory distress. She has no wheezes.  Musculoskeletal: She exhibits no edema or tenderness.  Lymphadenopathy:    She has no cervical adenopathy.  Neurological: She is alert and oriented to person, place, and time. She exhibits normal muscle tone.  Skin: Skin is warm and dry. No rash noted. She is not diaphoretic. No erythema. No pallor.  Psychiatric: She has a normal mood and affect. Her behavior is normal. Judgment and thought content normal.     Assessment/Plan Symptomatic cholelithiasis  OR for cholecystectomy All questions asked and answered scds Iv abx  Mary SellaEric M. Andrey CampanileWilson, MD, FACS General, Bariatric, & Minimally Invasive Surgery [facility], [place]  Atilano InaWILSON,Finnian Husted M, MD 10/16/2016, 6:50 PM

## 2016-10-17 ENCOUNTER — Ambulatory Visit (HOSPITAL_COMMUNITY)
Admission: RE | Admit: 2016-10-17 | Discharge: 2016-10-17 | Disposition: A | Discharge status: Home / Self Care | Payer: Medicaid Other | Source: Ambulatory Visit | Attending: General Surgery | Admitting: General Surgery

## 2016-10-17 ENCOUNTER — Encounter (HOSPITAL_COMMUNITY)
Admission: RE | Disposition: A | Discharge status: Home / Self Care | Payer: Self-pay | Source: Ambulatory Visit | Attending: General Surgery

## 2016-10-17 ENCOUNTER — Ambulatory Visit (HOSPITAL_COMMUNITY): Payer: Medicaid Other | Admitting: Anesthesiology

## 2016-10-17 ENCOUNTER — Ambulatory Visit (HOSPITAL_COMMUNITY): Payer: Medicaid Other

## 2016-10-17 ENCOUNTER — Encounter (HOSPITAL_COMMUNITY): Payer: Self-pay | Admitting: *Deleted

## 2016-10-17 DIAGNOSIS — I10 Essential (primary) hypertension: Secondary | ICD-10-CM | POA: Insufficient documentation

## 2016-10-17 DIAGNOSIS — E669 Obesity, unspecified: Secondary | ICD-10-CM | POA: Insufficient documentation

## 2016-10-17 DIAGNOSIS — Z6839 Body mass index (BMI) 39.0-39.9, adult: Secondary | ICD-10-CM | POA: Insufficient documentation

## 2016-10-17 DIAGNOSIS — K8010 Calculus of gallbladder with chronic cholecystitis without obstruction: Secondary | ICD-10-CM | POA: Diagnosis not present

## 2016-10-17 DIAGNOSIS — Z419 Encounter for procedure for purposes other than remedying health state, unspecified: Secondary | ICD-10-CM

## 2016-10-17 DIAGNOSIS — K219 Gastro-esophageal reflux disease without esophagitis: Secondary | ICD-10-CM | POA: Insufficient documentation

## 2016-10-17 HISTORY — PX: CHOLECYSTECTOMY: SHX55

## 2016-10-17 SURGERY — LAPAROSCOPIC CHOLECYSTECTOMY WITH INTRAOPERATIVE CHOLANGIOGRAM
Anesthesia: General | Site: Abdomen

## 2016-10-17 MED ORDER — SODIUM CHLORIDE 0.9 % IV SOLN: 250.0000 mL | INTRAVENOUS | Status: DC | PRN

## 2016-10-17 MED ORDER — BUPIVACAINE HCL 0.5 % IJ SOLN
INTRAMUSCULAR | Status: DC | PRN
  Administered 2016-10-17: 30 mL

## 2016-10-17 MED ORDER — ROCURONIUM BROMIDE 50 MG/5ML IV SOSY
PREFILLED_SYRINGE | INTRAVENOUS | Status: AC
  Filled 2016-10-17: qty 5

## 2016-10-17 MED ORDER — ROCURONIUM BROMIDE 100 MG/10ML IV SOLN
INTRAVENOUS | Status: DC | PRN
  Administered 2016-10-17: 10 mg via INTRAVENOUS
  Administered 2016-10-17: 50 mg via INTRAVENOUS
  Administered 2016-10-17: 10 mg via INTRAVENOUS

## 2016-10-17 MED ORDER — ACETAMINOPHEN 500 MG PO TABS
1000.0000 mg | ORAL_TABLET | ORAL | Status: AC
  Administered 2016-10-17: 1000 mg via ORAL
  Filled 2016-10-17: qty 2

## 2016-10-17 MED ORDER — BUPIVACAINE HCL (PF) 0.5 % IJ SOLN
INTRAMUSCULAR | Status: AC
  Filled 2016-10-17: qty 30

## 2016-10-17 MED ORDER — HYDROMORPHONE HCL 1 MG/ML IJ SOLN
INTRAMUSCULAR | Status: AC
  Filled 2016-10-17: qty 1

## 2016-10-17 MED ORDER — CHLORHEXIDINE GLUCONATE CLOTH 2 % EX PADS: 6.0000 | MEDICATED_PAD | Freq: Once | CUTANEOUS | Status: DC

## 2016-10-17 MED ORDER — ESMOLOL HCL 100 MG/10ML IV SOLN
INTRAVENOUS | Status: DC | PRN
  Administered 2016-10-17: 10 mg via INTRAVENOUS

## 2016-10-17 MED ORDER — HYDROMORPHONE HCL 1 MG/ML IJ SOLN
0.2500 mg | INTRAMUSCULAR | Status: DC | PRN
  Administered 2016-10-17 (×4): 0.5 mg via INTRAVENOUS

## 2016-10-17 MED ORDER — LIDOCAINE HCL (CARDIAC) 20 MG/ML IV SOLN
INTRAVENOUS | Status: DC | PRN
  Administered 2016-10-17: 80 mg via INTRAVENOUS

## 2016-10-17 MED ORDER — SODIUM CHLORIDE 0.9% FLUSH: 3.0000 mL | Freq: Two times a day (BID) | INTRAVENOUS | Status: DC

## 2016-10-17 MED ORDER — CEFOTETAN DISODIUM-DEXTROSE 2-2.08 GM-% IV SOLR
2.0000 g | INTRAVENOUS | Status: AC
  Administered 2016-10-17: 2 g via INTRAVENOUS

## 2016-10-17 MED ORDER — LACTATED RINGERS IV SOLN
INTRAVENOUS | Status: DC | PRN
  Administered 2016-10-17 (×2): via INTRAVENOUS

## 2016-10-17 MED ORDER — SODIUM CHLORIDE 0.9 % IV SOLN
INTRAVENOUS | Status: DC | PRN
  Administered 2016-10-17: 10 mL

## 2016-10-17 MED ORDER — SUGAMMADEX SODIUM 500 MG/5ML IV SOLN
INTRAVENOUS | Status: AC
  Filled 2016-10-17: qty 5

## 2016-10-17 MED ORDER — PROMETHAZINE HCL 25 MG/ML IJ SOLN: 6.2500 mg | INTRAMUSCULAR | Status: DC | PRN

## 2016-10-17 MED ORDER — LIDOCAINE HCL 1 % IJ SOLN
INTRAMUSCULAR | Status: AC
  Filled 2016-10-17: qty 80

## 2016-10-17 MED ORDER — IOPAMIDOL (ISOVUE-300) INJECTION 61%
INTRAVENOUS | Status: AC
  Filled 2016-10-17: qty 50

## 2016-10-17 MED ORDER — 0.9 % SODIUM CHLORIDE (POUR BTL) OPTIME
TOPICAL | Status: DC | PRN
  Administered 2016-10-17: 1000 mL

## 2016-10-17 MED ORDER — ESMOLOL HCL 100 MG/10ML IV SOLN
INTRAVENOUS | Status: AC
  Filled 2016-10-17: qty 10

## 2016-10-17 MED ORDER — ACETAMINOPHEN 650 MG RE SUPP
650.0000 mg | RECTAL | Status: DC | PRN
  Filled 2016-10-17: qty 1

## 2016-10-17 MED ORDER — FENTANYL CITRATE (PF) 100 MCG/2ML IJ SOLN: 25.0000 ug | INTRAMUSCULAR | Status: DC | PRN

## 2016-10-17 MED ORDER — DEXAMETHASONE SODIUM PHOSPHATE 10 MG/ML IJ SOLN
INTRAMUSCULAR | Status: AC
  Filled 2016-10-17: qty 1

## 2016-10-17 MED ORDER — OXYCODONE HCL 5 MG PO TABS
5.0000 mg | ORAL_TABLET | ORAL | Status: DC | PRN
  Administered 2016-10-17: 10 mg via ORAL
  Filled 2016-10-17: qty 2

## 2016-10-17 MED ORDER — LACTATED RINGERS IV SOLN: INTRAVENOUS | Status: DC

## 2016-10-17 MED ORDER — ONDANSETRON HCL 4 MG/2ML IJ SOLN
INTRAMUSCULAR | Status: DC | PRN
  Administered 2016-10-17: 4 mg via INTRAVENOUS

## 2016-10-17 MED ORDER — CEFOTETAN DISODIUM-DEXTROSE 2-2.08 GM-% IV SOLR
INTRAVENOUS | Status: AC
  Filled 2016-10-17: qty 50

## 2016-10-17 MED ORDER — DEXAMETHASONE SODIUM PHOSPHATE 10 MG/ML IJ SOLN
INTRAMUSCULAR | Status: DC | PRN
  Administered 2016-10-17: 10 mg via INTRAVENOUS

## 2016-10-17 MED ORDER — ACETAMINOPHEN 325 MG PO TABS: 650.0000 mg | ORAL_TABLET | ORAL | Status: DC | PRN

## 2016-10-17 MED ORDER — EPHEDRINE SULFATE 50 MG/ML IJ SOLN
INTRAMUSCULAR | Status: DC | PRN
  Administered 2016-10-17 (×3): 5 mg via INTRAVENOUS

## 2016-10-17 MED ORDER — SUCCINYLCHOLINE CHLORIDE 20 MG/ML IJ SOLN
INTRAMUSCULAR | Status: AC
  Filled 2016-10-17: qty 1

## 2016-10-17 MED ORDER — FENTANYL CITRATE (PF) 250 MCG/5ML IJ SOLN
INTRAMUSCULAR | Status: AC
  Filled 2016-10-17: qty 5

## 2016-10-17 MED ORDER — PROPOFOL 10 MG/ML IV BOLUS
INTRAVENOUS | Status: DC | PRN
  Administered 2016-10-17: 200 mg via INTRAVENOUS

## 2016-10-17 MED ORDER — ACETAMINOPHEN 500 MG PO TABS
1000.0000 mg | ORAL_TABLET | Freq: Four times a day (QID) | ORAL | Status: DC
  Filled 2016-10-17 (×4): qty 2

## 2016-10-17 MED ORDER — MIDAZOLAM HCL 2 MG/2ML IJ SOLN
INTRAMUSCULAR | Status: AC
  Filled 2016-10-17: qty 2

## 2016-10-17 MED ORDER — KETOROLAC TROMETHAMINE 30 MG/ML IJ SOLN
INTRAMUSCULAR | Status: DC | PRN
  Administered 2016-10-17: 30 mg via INTRAVENOUS

## 2016-10-17 MED ORDER — METOCLOPRAMIDE HCL 5 MG/ML IJ SOLN
INTRAMUSCULAR | Status: AC
  Filled 2016-10-17: qty 2

## 2016-10-17 MED ORDER — MIDAZOLAM HCL 5 MG/5ML IJ SOLN
INTRAMUSCULAR | Status: DC | PRN
  Administered 2016-10-17: 2 mg via INTRAVENOUS

## 2016-10-17 MED ORDER — METOCLOPRAMIDE HCL 5 MG/ML IJ SOLN
INTRAMUSCULAR | Status: DC | PRN
  Administered 2016-10-17: 10 mg via INTRAVENOUS

## 2016-10-17 MED ORDER — OXYCODONE HCL 5 MG PO TABS: 5.0000 mg | ORAL_TABLET | ORAL | 0 refills | Status: DC | PRN

## 2016-10-17 MED ORDER — KETOROLAC TROMETHAMINE 30 MG/ML IJ SOLN
INTRAMUSCULAR | Status: AC
  Filled 2016-10-17: qty 1

## 2016-10-17 MED ORDER — ONDANSETRON HCL 4 MG/2ML IJ SOLN
INTRAMUSCULAR | Status: AC
  Filled 2016-10-17: qty 2

## 2016-10-17 MED ORDER — SODIUM CHLORIDE 0.9% FLUSH
3.0000 mL | INTRAVENOUS | Status: DC | PRN
Start: 2016-10-17 — End: 2016-10-17

## 2016-10-17 MED ORDER — LIDOCAINE 2% (20 MG/ML) 5 ML SYRINGE
INTRAMUSCULAR | Status: AC
  Filled 2016-10-17: qty 5

## 2016-10-17 MED ORDER — LACTATED RINGERS IR SOLN
Status: DC | PRN
  Administered 2016-10-17: 1000 mL

## 2016-10-17 MED ORDER — FENTANYL CITRATE (PF) 100 MCG/2ML IJ SOLN
INTRAMUSCULAR | Status: DC | PRN
  Administered 2016-10-17 (×2): 50 ug via INTRAVENOUS
  Administered 2016-10-17: 150 ug via INTRAVENOUS

## 2016-10-17 MED ORDER — SUGAMMADEX SODIUM 500 MG/5ML IV SOLN
INTRAVENOUS | Status: DC | PRN
  Administered 2016-10-17: 457.2 mg via INTRAVENOUS

## 2016-10-17 MED ORDER — KETOROLAC TROMETHAMINE 30 MG/ML IJ SOLN: 30.0000 mg | Freq: Four times a day (QID) | INTRAMUSCULAR | Status: DC

## 2016-10-17 MED ORDER — PROPOFOL 10 MG/ML IV BOLUS
INTRAVENOUS | Status: AC
  Filled 2016-10-17: qty 40

## 2016-10-17 MED FILL — oxyCODONE HCL 5 MG TABS: 5 | 6 days supply | Qty: 40 | Fill #0

## 2016-10-17 SURGICAL SUPPLY — 51 items
APL SKNCLS STERI-STRIP NONHPOA (GAUZE/BANDAGES/DRESSINGS) ×1
APL SRG 38 LTWT LNG FL B (MISCELLANEOUS)
APPLICATOR ARISTA FLEXITIP XL (MISCELLANEOUS) IMPLANT
APPLIER CLIP 5 13 M/L LIGAMAX5 (MISCELLANEOUS) ×2
APPLIER CLIP ROT 10 11.4 M/L (STAPLE) ×2
APR CLP MED LRG 11.4X10 (STAPLE) ×1
APR CLP MED LRG 5 ANG JAW (MISCELLANEOUS) ×1
BAG SPEC RTRVL 10 TROC 200 (ENDOMECHANICALS) ×1
BANDAGE ADH SHEER 1  50/CT (GAUZE/BANDAGES/DRESSINGS) ×8 IMPLANT
BENZOIN TINCTURE PRP APPL 2/3 (GAUZE/BANDAGES/DRESSINGS) ×2 IMPLANT
CABLE HIGH FREQUENCY MONO STRZ (ELECTRODE) ×2 IMPLANT
CHLORAPREP W/TINT 26ML (MISCELLANEOUS) ×2 IMPLANT
CLIP APPLIE 5 13 M/L LIGAMAX5 (MISCELLANEOUS) ×1 IMPLANT
CLIP APPLIE ROT 10 11.4 M/L (STAPLE) IMPLANT
CLOSURE STERI-STRIP 1/4X4 (GAUZE/BANDAGES/DRESSINGS) IMPLANT
COVER MAYO STAND STRL (DRAPES) ×1 IMPLANT
COVER SURGICAL LIGHT HANDLE (MISCELLANEOUS) ×2 IMPLANT
DECANTER SPIKE VIAL GLASS SM (MISCELLANEOUS) ×2 IMPLANT
DRAPE C-ARM 42X120 X-RAY (DRAPES) ×1 IMPLANT
DRSG TEGADERM 2-3/8X2-3/4 SM (GAUZE/BANDAGES/DRESSINGS) ×2 IMPLANT
DRSG TEGADERM 4X4.75 (GAUZE/BANDAGES/DRESSINGS) ×1 IMPLANT
ELECT PENCIL ROCKER SW 15FT (MISCELLANEOUS) IMPLANT
ELECT REM PT RETURN 9FT ADLT (ELECTROSURGICAL) ×2
ELECTRODE REM PT RTRN 9FT ADLT (ELECTROSURGICAL) ×1 IMPLANT
GAUZE SPONGE 2X2 8PLY STRL LF (GAUZE/BANDAGES/DRESSINGS) ×1 IMPLANT
GLOVE BIO SURGEON STRL SZ7.5 (GLOVE) ×2 IMPLANT
GLOVE INDICATOR 8.0 STRL GRN (GLOVE) ×2 IMPLANT
GOWN STRL REUS W/TWL XL LVL3 (GOWN DISPOSABLE) ×6 IMPLANT
HEMOSTAT ARISTA ABSORB 3G PWDR (MISCELLANEOUS) IMPLANT
HEMOSTAT SNOW SURGICEL 2X4 (HEMOSTASIS) IMPLANT
IRRIG SUCT STRYKERFLOW 2 WTIP (MISCELLANEOUS) ×2
IRRIGATION SUCT STRKRFLW 2 WTP (MISCELLANEOUS) ×1 IMPLANT
KIT BASIN OR (CUSTOM PROCEDURE TRAY) ×2 IMPLANT
L-HOOK LAP DISP 36CM (ELECTROSURGICAL)
LHOOK LAP DISP 36CM (ELECTROSURGICAL) IMPLANT
POUCH RETRIEVAL ECOSAC 10 (ENDOMECHANICALS) ×1 IMPLANT
POUCH RETRIEVAL ECOSAC 10MM (ENDOMECHANICALS) ×1
SCISSORS LAP 5X35 DISP (ENDOMECHANICALS) ×2 IMPLANT
SET CHOLANGIOGRAPH MIX (MISCELLANEOUS) ×1 IMPLANT
SLEEVE XCEL OPT CAN 5 100 (ENDOMECHANICALS) ×4 IMPLANT
SPONGE GAUZE 2X2 STER 10/PKG (GAUZE/BANDAGES/DRESSINGS) ×1
STRIP CLOSURE SKIN 1/4X4 (GAUZE/BANDAGES/DRESSINGS) ×1 IMPLANT
SUT MNCRL AB 4-0 PS2 18 (SUTURE) ×2 IMPLANT
SUT VICRYL 0 UR6 27IN ABS (SUTURE) IMPLANT
TOWEL OR 17X26 10 PK STRL BLUE (TOWEL DISPOSABLE) ×2 IMPLANT
TOWEL OR NON WOVEN STRL DISP B (DISPOSABLE) ×2 IMPLANT
TRAY LAPAROSCOPIC (CUSTOM PROCEDURE TRAY) ×2 IMPLANT
TROCAR BLADELESS OPT 5 100 (ENDOMECHANICALS) ×2 IMPLANT
TROCAR XCEL BLUNT TIP 100MML (ENDOMECHANICALS) ×2 IMPLANT
TROCAR XCEL NON-BLD 11X100MML (ENDOMECHANICALS) IMPLANT
TUBING INSUF HEATED (TUBING) ×2 IMPLANT

## 2016-10-17 NOTE — Anesthesia Procedure Notes (Signed)
Procedure Name: Intubation Date/Time: 10/17/2016 7:43 AM Performed by: Constanza Mincy, Nuala AlphaKRISTOPHER Pre-anesthesia Checklist: Patient identified, Emergency Drugs available, Suction available, Patient being monitored and Timeout performed Patient Re-evaluated:Patient Re-evaluated prior to inductionOxygen Delivery Method: Circle system utilized Preoxygenation: Pre-oxygenation with 100% oxygen Intubation Type: IV induction Ventilation: Mask ventilation without difficulty Laryngoscope Size: Mac and 4 Grade View: Grade II Tube type: Oral Tube size: 7.5 mm Number of attempts: 1 Airway Equipment and Method: Stylet Placement Confirmation: ETT inserted through vocal cords under direct vision,  positive ETCO2,  CO2 detector and breath sounds checked- equal and bilateral Secured at: 22 cm Tube secured with: Tape Dental Injury: Teeth and Oropharynx as per pre-operative assessment

## 2016-10-17 NOTE — Transfer of Care (Signed)
Immediate Anesthesia Transfer of Care Note  Patient: Dana MoronAmber C Knupp  Procedure(s) Performed: Procedure(s): LAPAROSCOPIC CHOLECYSTECTOMY WITH INTRAOPERATIVE CHOLANGIOGRAM (N/A)  Patient Location: PACU  Anesthesia Type:General  Level of Consciousness:  sedated, patient cooperative and responds to stimulation  Airway & Oxygen Therapy:Patient Spontanous Breathing and Patient connected to face mask oxgen  Post-op Assessment:  Report given to PACU RN and Post -op Vital signs reviewed and stable  Post vital signs:  Reviewed and stable  Last Vitals:  Vitals:   10/17/16 0533  BP: 112/79  Pulse: 86  Resp: 16  Temp: 36.9 C    Complications: No apparent anesthesia complications

## 2016-10-17 NOTE — Discharge Instructions (Signed)
General Anesthesia, Adult, Care After Refer to this sheet in the next few weeks. These instructions provide you with information on caring for yourself after your procedure. Your health care provider may also give you more specific instructions. Your treatment has been planned according to current medical practices, but problems sometimes occur. Call your health care provider if you have any problems or questions after your procedure. WHAT TO EXPECT AFTER THE PROCEDURE After the procedure, it is typical to experience:  Sleepiness.  Nausea and vomiting. HOME CARE INSTRUCTIONS  For the first 24 hours after general anesthesia:  Have a responsible person with you.  Do not drive a car. If you are alone, do not take public transportation.  Do not drink alcohol.  Do not take medicine that has not been prescribed by your health care provider.  Do not sign important papers or make important decisions.  You may resume a normal diet and activities as directed by your health care provider.  Change bandages (dressings) as directed.  If you have questions or problems that seem related to general anesthesia, call the hospital and ask for the anesthetist or anesthesiologist on call. SEEK MEDICAL CARE IF:  You have nausea and vomiting that continue the day after anesthesia.  You develop a rash. SEEK IMMEDIATE MEDICAL CARE IF:   You have difficulty breathing.  You have chest pain.  You have any allergic problems.   This information is not intended to replace advice given to you by your health care provider. Make sure you discuss any questions you have with your health care provider.   Document Released: 03/03/2001 Document Revised: 12/16/2014 Document Reviewed: 03/25/2012 Elsevier Interactive Patient Education 2016 [employer]                                                                                                                                                            [facility] LAPAROSCOPIC SURGERY: POST OP INSTRUCTIONS Always review your discharge instruction sheet given to you by the facility where your surgery was performed. IF YOU HAVE DISABILITY OR FAMILY LEAVE FORMS, YOU MUST BRING THEM TO THE OFFICE FOR PROCESSING.   DO NOT GIVE THEM TO YOUR DOCTOR.  1. A prescription for pain medication may be given to you upon discharge.  Take your pain medication as prescribed, if needed.  If narcotic pain medicine is not needed, then you may take acetaminophen (Tylenol) and/or ibuprofen (Advil) as needed. 2. Take your usually prescribed medications unless otherwise directed. 3. If you need a refill on your pain medication, please contact your pharmacy.  They will contact our office to request authorization. Prescriptions will not be filled after 5pm or on week-ends. 4. You should follow a light diet the first few days after arrival home, such as soup and  crackers, etc.  Be sure to include lots of fluids daily. 5. Most patients will experience some swelling and bruising in the area of the incisions.  Ice packs will help.  Swelling and bruising can take several days to resolve.  6. It is common to experience some constipation if taking pain medication after surgery.  Increasing fluid intake and taking a stool softener (such as Colace) will usually help or prevent this problem from occurring.  A mild laxative (Milk of Magnesia or Miralax) should be taken according to package instructions if there are no bowel movements after 48 hours. 7. Unless discharge instructions indicate otherwise, you may remove your bandages 48 hours after surgery, and you may shower at that time.  You  have steri-strips (small skin tapes) in place directly over the incision.  These strips should be left on the skin for 7-10 days.   8. ACTIVITIES:  You may resume regular (light) daily activities beginning the next day--such as daily self-care, walking, climbing  stairs--gradually increasing activities as tolerated.  You may have sexual intercourse when it is comfortable.  Refrain from any heavy lifting or straining until approved by your doctor. a. You may drive when you are no longer taking prescription pain medication, you can comfortably wear a seatbelt, and you can safely maneuver your car and apply brakes. 9. You should see your doctor in the office for a follow-up appointment approximately 2-3 weeks after your surgery.  Make sure that you call for this appointment within a day or two after you arrive home to insure a convenient appointment time. 10. OTHER INSTRUCTIONS:  WHEN TO CALL YOUR DOCTOR: 1. Fever over 101.0 2. Inability to urinate 3. Continued bleeding from incision. 4. Increased pain, redness, or drainage from the incision. 5. Increasing abdominal pain  The clinic staff is available to answer your questions during regular business hours.  Please dont hesitate to call and ask to speak to one of the nurses for clinical concerns.  If you have a medical emergency, go to the nearest emergency room or call 911.  A surgeon from [facility] is always on call at the hospital. [address], [address], [place], [place]  [address] ? [address] [address], [place], [place]   [address] [phone] ? [phone] ? FAX [phone] Web site: [email or web address]

## 2016-10-17 NOTE — Anesthesia Preprocedure Evaluation (Addendum)
Anesthesia Evaluation  Patient identified by MRN, date of birth, ID band Patient awake    Reviewed: Allergy & Precautions, NPO status , Patient's Chart, lab work & pertinent test results  History of Anesthesia Complications Negative for: history of anesthetic complications  Airway Mallampati: II  TM Distance: >3 FB Neck ROM: Full    Dental  (+) Poor Dentition, Chipped, Dental Advisory Given,    Pulmonary neg pulmonary ROS,    breath sounds clear to auscultation       Cardiovascular hypertension,  Rhythm:Regular Rate:Normal     Neuro/Psych negative neurological ROS     GI/Hepatic Neg liver ROS, GERD  ,  Endo/Other  negative endocrine ROS  Renal/GU negative Renal ROS     Musculoskeletal negative musculoskeletal ROS (+)   Abdominal (+) + obese,   Peds  Hematology negative hematology ROS (+)   Anesthesia Other Findings   Reproductive/Obstetrics                            Anesthesia Physical Anesthesia Plan  ASA: II  Anesthesia Plan: General   Post-op Pain Management:    Induction: Intravenous  Airway Management Planned: Oral ETT  Additional Equipment:   Intra-op Plan:   Post-operative Plan: Extubation in OR  Informed Consent:   Dental advisory given  Plan Discussed with: CRNA  Anesthesia Plan Comments:         Anesthesia Quick Evaluation

## 2016-10-17 NOTE — Anesthesia Postprocedure Evaluation (Signed)
Anesthesia Post Note  Patient: Duanne MoronAmber C Woznick  Procedure(s) Performed: Procedure(s) (LRB): LAPAROSCOPIC CHOLECYSTECTOMY WITH INTRAOPERATIVE CHOLANGIOGRAM (N/A)  Patient location during evaluation: PACU Anesthesia Type: General Level of consciousness: awake and alert Pain management: pain level controlled Vital Signs Assessment: post-procedure vital signs reviewed and stable Respiratory status: spontaneous breathing, nonlabored ventilation, respiratory function stable and patient connected to nasal cannula oxygen Cardiovascular status: blood pressure returned to baseline and stable Postop Assessment: no signs of nausea or vomiting Anesthetic complications: no    Last Vitals:  Vitals:   10/17/16 1000 10/17/16 1012  BP:  119/66  Pulse:  100  Resp:  15  Temp: 36.4 C 36.8 C    Last Pain:  Vitals:   10/17/16 1017  TempSrc:   PainSc: 8                  Lyndsee Casa,JAMES TERRILL

## 2016-10-17 NOTE — Op Note (Signed)
Dana Moronmber C Dastrup [id number] 1986/07/16 10/17/2016  Laparoscopic Cholecystectomy with IOC Procedure Note  Indications: This patient presents with symptomatic gallbladder disease and will undergo laparoscopic cholecystectomy.  Pre-operative Diagnosis: Symptomatic cholelithiasis  Post-operative Diagnosis: Chronic calculus cholecystitis  Surgeon: Atilano InaWILSON,Mariateresa Batra M   Assistants: None  Anesthesia: General endotracheal anesthesia  Procedure Details  The patient was seen again in the Holding Room. The risks, benefits, complications, treatment options, and expected outcomes were discussed with the patient. The possibilities of reaction to medication, pulmonary aspiration, perforation of viscus, bleeding, recurrent infection, finding a normal gallbladder, the need for additional procedures, failure to diagnose a condition, the possible need to convert to an open procedure, and creating a complication requiring transfusion or operation were discussed with the patient. The likelihood of improving the patient's symptoms with return to their baseline status is good.  The patient and/or family concurred with the proposed plan, giving informed consent. The site of surgery properly noted. The patient was taken to Operating Room, identified as Dana Edwards and the procedure verified as Laparoscopic Cholecystectomy with Intraoperative Cholangiogram. A Time Out was held and the above information confirmed. Antibiotic prophylaxis was administered.   Prior to the induction of general anesthesia, antibiotic prophylaxis was administered. General endotracheal anesthesia was then administered and tolerated well. After the induction, the abdomen was prepped with Chloraprep and draped in the sterile fashion. The patient was positioned in the supine position.  Local anesthetic agent was injected into the skin near the umbilicus and an incision made. We dissected down to the abdominal fascia with blunt dissection.  The  fascia was incised vertically and we entered the peritoneal cavity bluntly.  A pursestring suture of 0-Vicryl was placed around the fascial opening.  The Hasson cannula was inserted and secured with the stay suture.  Pneumoperitoneum was then created with CO2 and tolerated well without any adverse changes in the patient's vital signs. An 5-mm port was placed in the subxiphoid position.  Two 5-mm ports were placed in the right upper quadrant. All skin incisions were infiltrated with a local anesthetic agent before making the incision and placing the trocars.   We positioned the patient in reverse Trendelenburg, tilted slightly to the patient's left.  The gallbladder was identified, the fundus grasped and retracted cephalad. Adhesions were lysed bluntly and with the electrocautery where indicated, taking care not to injure any adjacent organs or viscus. Her gallbladder was not very distended with bile however it had multiple large gallstones. It appeared chronically inflamed. The peritoneum overlying the gallbladder was slightly thickened. The infundibulum was grasped and retracted laterally, exposing the peritoneum overlying the triangle of Calot. This was then divided and exposed in a blunt fashion. A critical view of the cystic duct and cystic artery was obtained. The cystic artery was very prominent and anterior. The cystic duct was clearly identified and bluntly dissected circumferentially. The cystic duct was ligated with a clip distally.   An incision was made in the cystic duct and the [facility] cholangiogram catheter introduced. The catheter was secured using a clip. A cholangiogram was then obtained which showed good visualization of the distal and proximal biliary tree with no sign of filling defects or obstruction.  Contrast flowed easily into the duodenum. The catheter was then removed.   The cystic duct was then ligated with clips and divided. The cystic artery which had been identified & dissected free  was ligated with clips and divided as well.   The gallbladder was dissected from the liver bed  in retrograde fashion with the electrocautery. The gallbladder was removed and placed in an Ecco sac.  The gallbladder and Ecco sac were then removed through the umbilical port site. The liver bed was irrigated and inspected. Hemostasis was achieved with the electrocautery. Copious irrigation was utilized and was repeatedly aspirated until clear.  The pursestring suture was used to close the umbilical fascia.    We again inspected the right upper quadrant for hemostasis.  The umbilical closure was inspected and there was no air leak and nothing trapped within the closure. Pneumoperitoneum was released as we removed the trocars.  4-0 Monocryl was used to close the skin.   Benzoin, steri-strips, and clean dressings were applied. The patient was then extubated and brought to the recovery room in stable condition. Instrument, sponge, and needle counts were correct at closure and at the conclusion of the case.   Findings: Cholecystitis with Cholelithiasis  Estimated Blood Loss: Minimal         Drains: None         Specimens: Gallbladder           Complications: None; patient tolerated the procedure well.         Disposition: PACU - hemodynamically stable.         Condition: stable  Mary SellaEric M. Andrey CampanileWilson, MD, FACS General, Bariatric, & Minimally Invasive Surgery [facility], [place]

## 2018-05-09 ENCOUNTER — Other Ambulatory Visit: Payer: Self-pay

## 2018-05-09 ENCOUNTER — Encounter (HOSPITAL_COMMUNITY): Payer: Self-pay | Admitting: Emergency Medicine

## 2018-05-09 ENCOUNTER — Ambulatory Visit (HOSPITAL_COMMUNITY)
Admission: EM | Admit: 2018-05-09 | Discharge: 2018-05-09 | Disposition: A | Discharge status: Home / Self Care | Payer: Self-pay | Attending: Internal Medicine | Admitting: Internal Medicine

## 2018-05-09 DIAGNOSIS — R599 Enlarged lymph nodes, unspecified: Secondary | ICD-10-CM

## 2018-05-09 DIAGNOSIS — H9201 Otalgia, right ear: Secondary | ICD-10-CM

## 2018-05-09 MED ORDER — IBUPROFEN 600 MG PO TABS: 600.0000 mg | ORAL_TABLET | Freq: Four times a day (QID) | ORAL | 0 refills | Status: DC | PRN

## 2018-05-09 MED ORDER — FLUTICASONE PROPIONATE 50 MCG/ACT NA SUSP: 1.0000 | Freq: Every day | NASAL | 2 refills | Status: AC

## 2018-05-09 NOTE — ED Triage Notes (Signed)
[facility] a [facility] "lump" behind the ear.

## 2018-05-09 NOTE — Discharge Instructions (Addendum)
Please follow up with primary care if symptoms have not resolved in 1 week. Apply heat to effected area,

## 2018-05-09 NOTE — ED Provider Notes (Signed)
[facility]    CSN: [id number] Arrival date & time: 05/09/18  1315     History   Chief Complaint Chief Complaint  Patient presents with  . Otalgia    HPI Dana Edwards is a 32 y.o. female.   32 y.o. female presents with "lumps" to back of right ear and from of left ear X 4 days. Patient report right ear pressure. Condition is acute in nature. Condition is made better by nothing, . Condition is made worse by nothing. Patient denies any treatment prior to there arrival at this facility. Patient denies any recent illness, no bruising, no axillary and supraclavicular involvement. No rash noted      Past Medical History:  Diagnosis Date  . Cholelithiasis   . GERD (gastroesophageal reflux disease)   . Hypertension    hx of 2016 but not on meds now     Patient Active Problem List   Diagnosis Date Noted  . Spontaneous onset of labor after 37 but before 39 completed weeks gestation with delivery by planned cesarean section 09/21/2015  . Active labor at term 09/21/2015  . VBAC (vaginal birth after Cesarean) 09/21/2015  . Evaluate anatomy not seen on prior sonogram   . [redacted] weeks gestation of pregnancy   . [redacted] weeks gestation of pregnancy 04/26/2015  . Pregnancy related abdominal pain of lower quadrant, antepartum   . Encounter for fetal anatomic survey     Past Surgical History:  Procedure Laterality Date  . CESAREAN SECTION  2007  . CHOLECYSTECTOMY N/A 10/17/2016   Procedure: LAPAROSCOPIC CHOLECYSTECTOMY WITH INTRAOPERATIVE CHOLANGIOGRAM;  Surgeon: Gaynelle Adu, MD;  Location: [facility];  Service: General;  Laterality: N/A;    OB History    Gravida  3   Para  3   Term  3   Preterm  0   AB  0   Living  3     SAB  0   TAB  0   Ectopic  0   Multiple  0   Live Births  3            Home Medications    Prior to Admission medications   Not on File    Family History History reviewed. No pertinent family history.  Social History Social  History   Tobacco Use  . Smoking status: Never Smoker  . Smokeless tobacco: Never Used  Substance Use Topics  . Alcohol use: No    Alcohol/week: 0.0 oz  . Drug use: No     Allergies   Patient has no known allergies.   Review of Systems Review of Systems  Constitutional: Negative for chills and fever.  HENT: Negative for ear pain and sore throat.   Eyes: Negative for pain and visual disturbance.  Respiratory: Negative for cough and shortness of breath.   Cardiovascular: Negative for chest pain and palpitations.  Gastrointestinal: Negative for abdominal pain and vomiting.  Genitourinary: Negative for dysuria and hematuria.  Musculoskeletal: Negative for arthralgias and back pain.  Skin: Negative for color change and rash.       Lumps top back of right ear and front of left ear.   Neurological: Negative for seizures and syncope.  All other systems reviewed and are negative.    Physical Exam Triage Vital Signs ED Triage Vitals [05/09/18 1414]  Enc Vitals Group     BP      Pulse Rate 75     Resp 16     Temp 98.7  F (37.1 C)     Temp Source Oral     SpO2 100 %     Weight      Height      Head Circumference      Peak Flow      Pain Score      Pain Loc      Pain Edu?      Excl. in GC?    No data found.  Updated Vital Signs Pulse 75   Temp 98.7 F (37.1 C) (Oral)   Resp 16   SpO2 100%   Visual Acuity Right Eye Distance:   Left Eye Distance:   Bilateral Distance:    Right Eye Near:   Left Eye Near:    Bilateral Near:     Physical Exam  Constitutional: She is oriented to person, place, and time. She appears well-developed and well-nourished.  HENT:  Head: Normocephalic and atraumatic.  Right Ear: External ear normal.  Left Ear: External ear normal.  Nose: Nose normal.  Mouth/Throat: Oropharynx is clear and moist.  Minimal Post auricular lymph node enlargement to right ear,. preauricular involvement to left ear   Eyes: Conjunctivae are normal.    Neck: Normal range of motion.  Pulmonary/Chest: Effort normal.  Neurological: She is alert and oriented to person, place, and time.  Psychiatric: She has a normal mood and affect.  Nursing note and vitals reviewed.    UC Treatments / Results  Labs (all labs ordered are listed, but only abnormal results are displayed) Labs Reviewed - No data to display  EKG None  Radiology No results found.  Procedures Procedures (including critical care time)  Medications Ordered in UC Medications - No data to display  Initial Impression / Assessment and Plan / UC Course  I have reviewed the triage vital signs and the nursing notes.  Pertinent labs & imaging results that were available during my care of the patient were reviewed by me and considered in my medical decision making (see chart for details).      Final Clinical Impressions(s) / UC Diagnoses   Final diagnoses:  None   Discharge Instructions   None    ED Prescriptions    None     Controlled Substance Prescriptions [place] Controlled Substance Registry consulted? Not Applicable   Alene MiresOmohundro, Deundre Thong C, NP 05/09/18 1433

## 2019-05-02 ENCOUNTER — Other Ambulatory Visit: Payer: Self-pay

## 2019-05-02 ENCOUNTER — Encounter (HOSPITAL_COMMUNITY): Payer: Self-pay

## 2019-05-02 ENCOUNTER — Inpatient Hospital Stay (HOSPITAL_COMMUNITY)
Admission: AD | Admit: 2019-05-02 | Discharge: 2019-05-02 | Disposition: A | Discharge status: Home / Self Care | Payer: Medicaid Other | Attending: Obstetrics & Gynecology | Admitting: Obstetrics & Gynecology

## 2019-05-02 DIAGNOSIS — O364XX1 Maternal care for intrauterine death, fetus 1: Secondary | ICD-10-CM

## 2019-05-02 DIAGNOSIS — O34219 Maternal care for unspecified type scar from previous cesarean delivery: Secondary | ICD-10-CM | POA: Diagnosis present

## 2019-05-02 DIAGNOSIS — O039 Complete or unspecified spontaneous abortion without complication: Secondary | ICD-10-CM | POA: Insufficient documentation

## 2019-05-02 DIAGNOSIS — R000 Tachycardia, unspecified: Secondary | ICD-10-CM | POA: Diagnosis not present

## 2019-05-02 DIAGNOSIS — R109 Unspecified abdominal pain: Secondary | ICD-10-CM | POA: Insufficient documentation

## 2019-05-02 DIAGNOSIS — O021 Missed abortion: Secondary | ICD-10-CM | POA: Diagnosis not present

## 2019-05-02 LAB — RAPID URINE DRUG SCREEN, HOSP PERFORMED
Amphetamines: NOT DETECTED
Barbiturates: NOT DETECTED
Benzodiazepines: NOT DETECTED
Cocaine: NOT DETECTED
Opiates: NOT DETECTED
Tetrahydrocannabinol: NOT DETECTED

## 2019-05-02 LAB — CBC
HCT: 33 % — ABNORMAL LOW (ref 36.0–46.0)
Hemoglobin: 10.9 g/dL — ABNORMAL LOW (ref 12.0–15.0)
MCH: 28.9 pg (ref 26.0–34.0)
MCHC: 33 g/dL (ref 30.0–36.0)
MCV: 87.5 fL (ref 80.0–100.0)
Platelets: 294 10*3/uL (ref 150–400)
RBC: 3.77 MIL/uL — ABNORMAL LOW (ref 3.87–5.11)
RDW: 14.3 % (ref 11.5–15.5)
WBC: 10.9 10*3/uL — ABNORMAL HIGH (ref 4.0–10.5)
nRBC: 0 % (ref 0.0–0.2)

## 2019-05-02 LAB — TYPE AND SCREEN
ABO/RH(D): O POS
Antibody Screen: NEGATIVE

## 2019-05-02 LAB — COMPREHENSIVE METABOLIC PANEL
ALT: 11 U/L (ref 0–44)
AST: 13 U/L — ABNORMAL LOW (ref 15–41)
Albumin: 3.1 g/dL — ABNORMAL LOW (ref 3.5–5.0)
Alkaline Phosphatase: 56 U/L (ref 38–126)
Anion gap: 10 (ref 5–15)
BUN: 6 mg/dL (ref 6–20)
CO2: 20 mmol/L — ABNORMAL LOW (ref 22–32)
Calcium: 8.9 mg/dL (ref 8.9–10.3)
Chloride: 106 mmol/L (ref 98–111)
Creatinine, Ser: 0.57 mg/dL (ref 0.44–1.00)
GFR calc Af Amer: >60 mL/min (ref >60)
GFR calc non Af Amer: >60 mL/min (ref >60)
Glucose, Bld: 94 mg/dL (ref 70–99)
Potassium: 3.5 mmol/L (ref 3.5–5.1)
Sodium: 136 mmol/L (ref 135–145)
Total Bilirubin: 0.3 mg/dL (ref 0.3–1.2)
Total Protein: 7.1 g/dL (ref 6.5–8.1)

## 2019-05-02 LAB — PROTIME-INR
INR: 1.1 (ref 0.8–1.2)
Prothrombin Time: 14 seconds (ref 11.4–15.2)

## 2019-05-02 LAB — SAVE SMEAR(SSMR), FOR PROVIDER SLIDE REVIEW

## 2019-05-02 LAB — ABO/RH: ABO/RH(D): O POS

## 2019-05-02 LAB — FIBRINOGEN: Fibrinogen: 586 mg/dL — ABNORMAL HIGH (ref 210–475)

## 2019-05-02 LAB — APTT: aPTT: 28 seconds (ref 24–36)

## 2019-05-02 MED ORDER — DOXYCYCLINE HYCLATE 100 MG PO TABS
200.0000 mg | ORAL_TABLET | Freq: Once | ORAL | Status: AC
  Administered 2019-05-02: 200 mg via ORAL
  Filled 2019-05-02: qty 2

## 2019-05-02 MED ORDER — OXYTOCIN 40 UNITS IN NORMAL SALINE INFUSION - SIMPLE MED
INTRAVENOUS | Status: AC
  Administered 2019-05-02: 40 [IU] via INTRAVENOUS
  Filled 2019-05-02: qty 1000

## 2019-05-02 MED ORDER — IBUPROFEN 600 MG PO TABS: 600.0000 mg | ORAL_TABLET | Freq: Four times a day (QID) | ORAL | 0 refills | Status: DC | PRN

## 2019-05-02 MED ORDER — FENTANYL CITRATE (PF) 100 MCG/2ML IJ SOLN
100.0000 ug | Freq: Once | INTRAMUSCULAR | Status: AC
  Administered 2019-05-02: 100 ug via INTRAVENOUS
  Filled 2019-05-02: qty 2

## 2019-05-02 MED ORDER — METHYLERGONOVINE MALEATE 0.2 MG/ML IJ SOLN
0.2000 mg | Freq: Once | INTRAMUSCULAR | Status: AC
  Administered 2019-05-02: 0.2 mg via INTRAMUSCULAR

## 2019-05-02 MED ORDER — PROMETHAZINE HCL 25 MG/ML IJ SOLN
12.5000 mg | Freq: Once | INTRAMUSCULAR | Status: AC
  Administered 2019-05-02: 11:00:00 12.5 mg via INTRAVENOUS
  Filled 2019-05-02: qty 1

## 2019-05-02 MED ORDER — LACTATED RINGERS IV SOLN
INTRAVENOUS | Status: DC
  Administered 2019-05-02: 08:00:00 via INTRAVENOUS

## 2019-05-02 MED ORDER — MISOPROSTOL 200 MCG PO TABS
600.0000 ug | ORAL_TABLET | Freq: Once | ORAL | Status: AC
  Administered 2019-05-02: 600 ug via BUCCAL
  Filled 2019-05-02: qty 3

## 2019-05-02 MED ORDER — OXYTOCIN 10 UNIT/ML IJ SOLN
INTRAMUSCULAR | Status: AC
  Administered 2019-05-02: 09:00:00 10 [IU]
  Filled 2019-05-02: qty 1

## 2019-05-02 MED ORDER — METHYLERGONOVINE MALEATE 0.2 MG/ML IJ SOLN
INTRAMUSCULAR | Status: AC
  Administered 2019-05-02: 0.2 mg via INTRAMUSCULAR
  Filled 2019-05-02: qty 1

## 2019-05-02 MED ORDER — OXYTOCIN 40 UNITS IN NORMAL SALINE INFUSION - SIMPLE MED
40.0000 [IU] | Freq: Once | INTRAVENOUS | Status: AC
  Administered 2019-05-02: 10:00:00 40 [IU] via INTRAVENOUS

## 2019-05-02 MED ORDER — METHYLERGONOVINE MALEATE 0.2 MG/ML IJ SOLN
0.2000 mg | Freq: Once | INTRAMUSCULAR | Status: AC
  Administered 2019-05-02: 0.2 mg via INTRAMUSCULAR
  Filled 2019-05-02: qty 1

## 2019-05-02 NOTE — MAU Note (Signed)
Pt arrived via EMS, pt reports she did not know she was pregnant until recently. Started having abdominal pains last night and about an hour before arrival she delivered a nonviable fetus.

## 2019-05-02 NOTE — MAU Provider Note (Addendum)
History    CSN: [id number] Arrival date and time: 05/02/19 4132 First Provider Initiated Contact with Patient 05/02/19 0745     Chief Complaint  Patient presents with  . delivery   HPI 33yo [phone] at [facility] [place] who presents from EMS with chief complaint of having had vaginal delivery at home of previable fetus. Patient states she did not know she was pregnant, has been having periods for last several months, though irregular. States she thought she might be pregnant this week because her period lasted longer than normal. Denies feeling fetal movement, denies vaginal discharge. States she was planning to go to the doctor this week, has not taken home pregnancy test. Denies any illicit substance use.   OB History    Gravida  4   Para  3   Term  3   Preterm  0   AB  0   Living  3     SAB  0   TAB  0   Ectopic  0   Multiple  0   Live Births  3           Past Medical History:  Diagnosis Date  . Cholelithiasis   . GERD (gastroesophageal reflux disease)   . Hypertension    hx of 2016 but not on meds now     Past Surgical History:  Procedure Laterality Date  . CESAREAN SECTION  2007  . CHOLECYSTECTOMY N/A 10/17/2016   Procedure: LAPAROSCOPIC CHOLECYSTECTOMY WITH INTRAOPERATIVE CHOLANGIOGRAM;  Surgeon: Greer Pickerel, MD;  Location: [facility];  Service: General;  Laterality: N/A;    History reviewed. No pertinent family history.  Social History   Tobacco Use  . Smoking status: Never Smoker  . Smokeless tobacco: Never Used  Substance Use Topics  . Alcohol use: No    Alcohol/week: 0.0 standard drinks  . Drug use: No    Allergies: No Known Allergies  Medications Prior to Admission  Medication Sig Dispense Refill Last Dose  . fluticasone (FLONASE) 50 MCG/ACT nasal spray Place 1 spray into both nostrils daily. 16 g 2   . ibuprofen (ADVIL,MOTRIN) 600 MG tablet Take 1 tablet (600 mg total) by mouth every 6 (six) hours as needed. 30 tablet 0     Review of  Systems  Constitutional: Negative for fatigue and fever.  Eyes: Negative for visual disturbance.  Respiratory: Negative for cough and shortness of breath.   Gastrointestinal: Positive for abdominal pain. Negative for nausea and vomiting.  Genitourinary: Positive for pelvic pain, vaginal bleeding and vaginal pain.  Neurological: Negative for dizziness, light-headedness and headaches.  Psychiatric/Behavioral: Positive for sleep disturbance. The patient is nervous/anxious.    Physical Exam   Blood pressure 137/81, pulse (!) 102, temperature 98.2 F (36.8 C), temperature source Oral, resp. rate 20, SpO2 99 %.  Physical Exam  Nursing note and vitals reviewed. Constitutional: She is oriented to person, place, and time. She appears well-developed and well-nourished.  Tearful, anxious-appearing  HENT:  Head: Normocephalic and atraumatic.  Eyes: Conjunctivae and EOM are normal.  Cardiovascular: Normal rate and intact distal pulses.  Respiratory: Effort normal. No respiratory distress.  GI: Soft. There is abdominal tenderness.  Genitourinary:    Genitourinary Comments: Normal external labia, internal exam deferred, no vaginal bleeding noted   Musculoskeletal:        General: Edema (trace) present.  Neurological: She is alert and oriented to person, place, and time.  Psychiatric:  Tearful, appears overwhelmed   MAU Course  Procedures  MDM --  heard dispatch of EMS call - around 0700 they arrived at home and found patient standing over toilet with "infant hanging out of her", stating not sure how far along but maybe about 4-5 months -- met patient and EMS crew at ambulance bay entrance, patient appeared stable and not in obvious pain so taken up to MAU -- in MAU room, IV started and patient noted to have fetus completely delivered with cord still attached, infant without heartbeat and cold to touch, consistent with GA <20 weeks  discussed with patient that fetus had passed away, we would be  starting IV, checking labs and giving her pain medication. She was tearful throughout encounter, is very worried about coronavirus while being in hospital. Reassurance provided.  -- Cord clamped and cut, SVE and placenta not palpated in vagina -- discussed with Dr. Harolyn Rutherford who recommended cytotec 639m buccal, will also give IV Fentanyl, check T&S, CBC, CMP and UDS, DIC panel  -- BP slightly elevated on arrival, will continue to monitor -- offered for patient to speak with chaplain but she declined at this time   0850: Into room to see patient who had called out because of feeling something come out. Vaginal bleeding and small clots soaking through pad noted. Placenta still not delivered and did not deliver with gentle cord traction. Will give IM methergine. [facility]   *Consult with Dr. AHarolyn Rutherford@ [phone]- orders received to observe x 1-2 hrs to watch VB. If VB slows down, may d/c home with F/U in 2 wks  1210: (reassessment) small amount of VB, pt able to ambulate to bathroom, intact LOC, desires to go home so she can attend her mother's memorial service this afternoon.  Results for orders placed or performed during the hospital encounter of 05/02/19 (from the past 24 hour(s))  Type and screen [facility]    Status: None   Collection Time: 05/02/19  7:42 AM  Result Value Ref Range   ABO/RH(D) O POS    Antibody Screen NEG    Sample Expiration      05/05/2019,2359 [facility]formed at [id number], [place] [id number]  Comprehensive metabolic panel     Status: Abnormal   Collection Time: 05/02/19  7:42 AM  Result Value Ref Range   Sodium 136 135 - 145 mmol/L   Potassium 3.5 3.5 - 5.1 mmol/L   Chloride 106 98 - 111 mmol/L   CO2 20 (L) 22 - 32 mmol/L   Glucose, Bld 94 70 - 99 mg/dL   BUN 6 6 - 20 mg/dL   Creatinine, Ser 0.57 0.44 - 1.00 mg/dL   Calcium 8.9 8.9 - 10.3 mg/dL   Total Protein 7.1 6.5 - 8.1 g/dL   Albumin  3.1 (L) 3.5 - 5.0 g/dL   AST 13 (L) 15 - 41 U/L   ALT 11 0 - 44 U/L   Alkaline Phosphatase 56 38 - 126 U/L   Total Bilirubin 0.3 0.3 - 1.2 mg/dL   GFR calc non Af Amer >60 >60 mL/min   GFR calc Af Amer >60 >60 mL/min   Anion gap 10 5 - 15  CBC     Status: Abnormal   Collection Time: 05/02/19  7:42 AM  Result Value Ref Range   WBC 10.9 (H) 4.0 - 10.5 K/uL   RBC 3.77 (L) 3.87 - 5.11 MIL/uL   Hemoglobin 10.9 (L) 12.0 - 15.0 g/dL   HCT 33.0 (  L) 36.0 - 46.0 %   MCV 87.5 80.0 - 100.0 fL   MCH 28.9 26.0 - 34.0 pg   MCHC 33.0 30.0 - 36.0 g/dL   RDW 14.3 11.5 - 15.5 %   Platelets 294 150 - 400 K/uL   nRBC 0.0 0.0 - 0.2 %  Save Smear     Status: None   Collection Time: 05/02/19  7:42 AM  Result Value Ref Range   Smear Review SMEAR STAINED AND AVAILABLE FOR REVIEW   Urine rapid drug screen (hosp performed)     Status: None   Collection Time: 05/02/19  9:43 AM  Result Value Ref Range   Opiates NONE DETECTED NONE DETECTED   Cocaine NONE DETECTED NONE DETECTED   Benzodiazepines NONE DETECTED NONE DETECTED   Amphetamines NONE DETECTED NONE DETECTED   Tetrahydrocannabinol NONE DETECTED NONE DETECTED   Barbiturates NONE DETECTED NONE DETECTED  Fibrinogen     Status: Abnormal   Collection Time: 05/02/19 10:32 AM  Result Value Ref Range   Fibrinogen 586 (H) 210 - 475 mg/dL  Protime-INR     Status: None   Collection Time: 05/02/19 10:32 AM  Result Value Ref Range   Prothrombin Time 14.0 11.4 - 15.2 seconds   INR 1.1 0.8 - 1.2  APTT     Status: None   Collection Time: 05/02/19 10:32 AM  Result Value Ref Range   aPTT 28 24 - 36 seconds    Assessment and Plan  Stillbirth  - Information provided on fetal demise, miscarriage & coping with pregnancy loss - Discharge patient - Advised when to return to MAU   If you have heavier bleeding that soaks through more that 2 pads per hour for an hour or more  If you bleed so much that you feel like you might pass out or you do pass out  If you  have significant abdominal pain that is not improved with Tylenol   If you develop a fever > 100.5 - F/U with [facility] with a provider in 2 wks  - Patient verbalized an understanding of the plan of care and agrees.   Laury Deep, CNM  05/02/2019 1:00 PM

## 2019-05-02 NOTE — MAU Note (Signed)
Provider informs this RN that the fetus appears to be under 20 weeks

## 2019-05-02 NOTE — Progress Notes (Signed)
[facility] Attending Note  Called to evaluate patient with increased bleeding in the setting of retained placenta. On evaluation, significant bleeding noted, EBL at least 500 ml. She had received Methergine dose prior to my arrival. Bimanual exam revealed placenta to be at cervical os.  Had patient push but she was unable to push it out.  Speculum exam done, placenta grasped with ring forceps and removed.  Smaller placental fragments were also removed. A curette was advanced into the LUS and smaller fragments were extracted, until no further fragments were palpated. Unable to advance into upper fundal area with curette.  Bleeding noted to subside significantly. IM Pitocin given, followed by IV pitocin in LR bolus.  Will observe closely. If bleeding increases again or gets more concerning, may need D&E in OR. Keep patient NPO for now.  Doxycycline 200 mg po x 1 dose to be administered for infection prophylaxis.     Jaynie Collins, MD, FACOG Obstetrician & Gynecologist, [facility] [employer], [facility]

## 2019-05-02 NOTE — MAU Note (Signed)
Pt does not wish to make arrangements for cremation at this time, will send fetus and placenta to pathology.

## 2019-05-18 ENCOUNTER — Ambulatory Visit: Payer: Medicaid Other | Admitting: Obstetrics and Gynecology

## 2020-08-30 ENCOUNTER — Emergency Department (HOSPITAL_COMMUNITY)
Admission: EM | Admit: 2020-08-30 | Discharge: 2020-08-30 | Disposition: A | Discharge status: Home / Self Care | Payer: Medicaid Other | Attending: Emergency Medicine | Admitting: Emergency Medicine

## 2020-08-30 ENCOUNTER — Encounter (HOSPITAL_COMMUNITY): Payer: Self-pay | Admitting: Emergency Medicine

## 2020-08-30 ENCOUNTER — Other Ambulatory Visit: Payer: Self-pay

## 2020-08-30 DIAGNOSIS — I10 Essential (primary) hypertension: Secondary | ICD-10-CM | POA: Diagnosis not present

## 2020-08-30 DIAGNOSIS — Z1152 Encounter for screening for COVID-19: Secondary | ICD-10-CM

## 2020-08-30 DIAGNOSIS — R509 Fever, unspecified: Secondary | ICD-10-CM | POA: Diagnosis present

## 2020-08-30 DIAGNOSIS — R0602 Shortness of breath: Secondary | ICD-10-CM | POA: Diagnosis not present

## 2020-08-30 DIAGNOSIS — B349 Viral infection, unspecified: Secondary | ICD-10-CM | POA: Insufficient documentation

## 2020-08-30 DIAGNOSIS — U071 COVID-19: Secondary | ICD-10-CM | POA: Insufficient documentation

## 2020-08-30 DIAGNOSIS — R0689 Other abnormalities of breathing: Secondary | ICD-10-CM | POA: Diagnosis not present

## 2020-08-30 DIAGNOSIS — Z209 Contact with and (suspected) exposure to unspecified communicable disease: Secondary | ICD-10-CM | POA: Diagnosis not present

## 2020-08-30 DIAGNOSIS — G4489 Other headache syndrome: Secondary | ICD-10-CM | POA: Diagnosis not present

## 2020-08-30 DIAGNOSIS — R52 Pain, unspecified: Secondary | ICD-10-CM | POA: Diagnosis not present

## 2020-08-30 LAB — SARS CORONAVIRUS 2 BY RT PCR (HOSPITAL ORDER, PERFORMED IN ~~LOC~~ HOSPITAL LAB): SARS Coronavirus 2: POSITIVE — AB

## 2020-08-30 MED ORDER — ACETAMINOPHEN 325 MG PO TABS: 650.0000 mg | ORAL_TABLET | Freq: Once | ORAL | Status: DC

## 2020-08-30 NOTE — ED Notes (Signed)
Pt NO LONGER IN ROOM

## 2020-08-30 NOTE — ED Triage Notes (Signed)
Pt to triage via [facility] from home.  Reports headache, body aches, fever, and generalized weakness since yesterday.  No known COVID exposures.

## 2020-08-30 NOTE — Discharge Instructions (Addendum)
You were tested for COVID-19 today, make sure to quarantine until you receive the results. If this is positive, please make sure to quarantine additional 7 to 10 days. You may take ibuprofen/Tylenol for body aches and fevers, drink plenty of fluids, over-the-counter cold and flu medications. You may also buy an over-the-counter pulse oximeter which can be found at your local pharmacy. If your oxygen saturation drops below 90%, please make sure to return to the ER. Return to the ER for any worsening shortness of breath.      Person Under Monitoring Name: Dana Edwards  Location: [address] [place] [address]   Infection Prevention Recommendations for Individuals Confirmed to have, or Being Evaluated for, 2019 Novel Coronavirus (COVID-19) Infection Who Receive Care at Home  Individuals who are confirmed to have, or are being evaluated for, COVID-19 should follow the prevention steps below until a healthcare provider or local or state health department says they can return to normal activities.  Stay home except to get medical care You should restrict activities outside your home, except for getting medical care. Do not go to work, school, or public areas, and do not use public transportation or taxis.  Call ahead before visiting your doctor Before your medical appointment, call the healthcare provider and tell them that you have, or are being evaluated for, COVID-19 infection. This will help the healthcare provider's office take steps to keep other people from getting infected. Ask your healthcare provider to call the local or state health department.  Monitor your symptoms Seek prompt medical attention if your illness is worsening (e.g., difficulty breathing). Before going to your medical appointment, call the healthcare provider and tell them that you have, or are being evaluated for, COVID-19 infection. Ask your healthcare provider to call the local or state health  department.  Wear a facemask You should wear a facemask that covers your nose and mouth when you are in the same room with other people and when you visit a healthcare provider. People who live with or visit you should also wear a facemask while they are in the same room with you.  Separate yourself from other people in your home As much as possible, you should stay in a different room from other people in your home. Also, you should use a separate bathroom, if available.  Avoid sharing household items You should not share dishes, drinking glasses, cups, eating utensils, towels, bedding, or other items with other people in your home. After using these items, you should wash them thoroughly with soap and water.  Cover your coughs and sneezes Cover your mouth and nose with a tissue when you cough or sneeze, or you can cough or sneeze into your sleeve. Throw used tissues in a lined trash can, and immediately wash your hands with soap and water for at least 20 seconds or use an alcohol-based hand rub.  Wash your [employer] your hands often and thoroughly with soap and water for at least 20 seconds. You can use an alcohol-based hand sanitizer if soap and water are not available and if your hands are not visibly dirty. Avoid touching your eyes, nose, and mouth with unwashed hands.   Prevention Steps for Caregivers and Household Members of Individuals Confirmed to have, or Being Evaluated for, COVID-19 Infection Being Cared for in the Home  If you live with, or provide care at home for, a person confirmed to have, or being evaluated for, COVID-19 infection please follow these guidelines to  prevent infection:  Follow healthcare provider's instructions Make sure that you understand and can help the patient follow any healthcare provider instructions for all care.  Provide for the patient's basic needs You should help the patient with basic needs in the home and provide support for getting  groceries, prescriptions, and other personal needs.  Monitor the patient's symptoms If they are getting sicker, call his or her medical provider and tell them that the patient has, or is being evaluated for, COVID-19 infection. This will help the healthcare provider's office take steps to keep other people from getting infected. Ask the healthcare provider to call the local or state health department.  Limit the number of people who have contact with the patient If possible, have only one caregiver for the patient. Other household members should stay in another home or place of residence. If this is not possible, they should stay in another room, or be separated from the patient as much as possible. Use a separate bathroom, if available. Restrict visitors who do not have an essential need to be in the home.  Keep older adults, very young children, and other sick people away from the patient Keep older adults, very young children, and those who have compromised immune systems or chronic health conditions away from the patient. This includes people with chronic heart, lung, or kidney conditions, diabetes, and cancer.  Ensure good ventilation Make sure that shared spaces in the home have good air flow, such as from an air conditioner or an opened window, weather permitting.  Wash your hands often Wash your hands often and thoroughly with soap and water for at least 20 seconds. You can use an alcohol based hand sanitizer if soap and water are not available and if your hands are not visibly dirty. Avoid touching your eyes, nose, and mouth with unwashed hands. Use disposable paper towels to dry your hands. If not available, use dedicated cloth towels and replace them when they become wet.  Wear a facemask and gloves Wear a disposable facemask at all times in the room and gloves when you touch or have contact with the patient's blood, body fluids, and/or secretions or excretions, such as sweat,  saliva, sputum, nasal mucus, vomit, urine, or feces.  Ensure the mask fits over your nose and mouth tightly, and do not touch it during use. Throw out disposable facemasks and gloves after using them. Do not reuse. Wash your hands immediately after removing your facemask and gloves. If your personal clothing becomes contaminated, carefully remove clothing and launder. Wash your hands after handling contaminated clothing. Place all used disposable facemasks, gloves, and other waste in a lined container before disposing them with other household waste. Remove gloves and wash your hands immediately after handling these items.  Do not share dishes, glasses, or other household items with the patient Avoid sharing household items. You should not share dishes, drinking glasses, cups, eating utensils, towels, bedding, or other items with a patient who is confirmed to have, or being evaluated for, COVID-19 infection. After the person uses these items, you should wash them thoroughly with soap and water.  Wash laundry thoroughly Immediately remove and wash clothes or bedding that have blood, body fluids, and/or secretions or excretions, such as sweat, saliva, sputum, nasal mucus, vomit, urine, or feces, on them. Wear gloves when handling laundry from the patient. Read and follow directions on labels of laundry or clothing items and detergent. In general, wash and dry with the warmest temperatures recommended on  the label.  Clean all areas the individual has used often Clean all touchable surfaces, such as counters, tabletops, doorknobs, bathroom fixtures, toilets, phones, keyboards, tablets, and bedside tables, every day. Also, clean any surfaces that may have blood, body fluids, and/or secretions or excretions on them. Wear gloves when cleaning surfaces the patient has come in contact with. Use a diluted bleach solution (e.g., dilute bleach with 1 part bleach and 10 parts water) or a household disinfectant  with a label that says EPA-registered for coronaviruses. To make a bleach solution at home, add 1 tablespoon of bleach to 1 quart (4 cups) of water. For a larger supply, add  cup of bleach to 1 gallon (16 cups) of water. Read labels of cleaning products and follow recommendations provided on product labels. Labels contain instructions for safe and effective use of the cleaning product including precautions you should take when applying the product, such as wearing gloves or eye protection and making sure you have good ventilation during use of the product. Remove gloves and wash hands immediately after cleaning.  Monitor yourself for signs and symptoms of illness Caregivers and household members are considered close contacts, should monitor their health, and will be asked to limit movement outside of the home to the extent possible. Follow the monitoring steps for close contacts listed on the symptom monitoring form.   ? If you have additional questions, contact your local health department or call the epidemiologist on call at [phone] (available 24/7). ? This guidance is subject to change. For the most up-to-date guidance from [facility], please refer to their website: [email or web address]

## 2020-08-30 NOTE — ED Provider Notes (Signed)
[facility] EMERGENCY DEPARTMENT Provider Note   CSN: [id number] Arrival date & time: 08/30/20  1408     History Chief Complaint  Patient presents with  . COVID testing  . Fever  . Generalized Body Aches    Dana Edwards is a 34 y.o. female.  HPI 34 year old female with history of GERD, hypertension presents to the ER with complaints of headache, body aches, fevers and generalized weakness since yesterday.  States she has been eating or drinking much.  No known Covid exposures, not vaccinated.  Denies any loss of taste or smell.  Denies any nausea or vomiting.  No urinary symptoms, diarrhea.  Denies any productive cough.  Denies any vision changes.    Past Medical History:  Diagnosis Date  . Cholelithiasis   . GERD (gastroesophageal reflux disease)   . Hypertension    hx of 2016 but not on meds now     Patient Active Problem List   Diagnosis Date Noted  . Stillbirth 05/02/2019  . VBAC (vaginal birth after Cesarean) 09/21/2015    Past Surgical History:  Procedure Laterality Date  . CESAREAN SECTION  2007  . CHOLECYSTECTOMY N/A 10/17/2016   Procedure: LAPAROSCOPIC CHOLECYSTECTOMY WITH INTRAOPERATIVE CHOLANGIOGRAM;  Surgeon: Gaynelle Adu, MD;  Location: [facility];  Service: General;  Laterality: N/A;     OB History    Gravida  4   Para  3   Term  3   Preterm  0   AB  0   Living  3     SAB  0   TAB  0   Ectopic  0   Multiple  0   Live Births  3           No family history on file.  Social History   Tobacco Use  . Smoking status: Never Smoker  . Smokeless tobacco: Never Used  Substance Use Topics  . Alcohol use: No    Alcohol/week: 0.0 standard drinks  . Drug use: No    Home Medications Prior to Admission medications   Medication Sig Start Date End Date Taking? Authorizing Provider  fluticasone (FLONASE) 50 MCG/ACT nasal spray Place 1 spray into both nostrils daily. 05/09/18   Alene Mires, NP  ibuprofen  (ADVIL) 600 MG tablet Take 1 tablet (600 mg total) by mouth every 6 (six) hours as needed. 05/02/19   Raelyn Mora, CNM    Allergies    Patient has no known allergies.  Review of Systems   Review of Systems  Constitutional: Positive for activity change, appetite change, chills, fatigue and fever.  HENT: Negative for sore throat.   Respiratory: Negative for cough and shortness of breath.   Cardiovascular: Negative for chest pain.  Gastrointestinal: Positive for diarrhea, nausea and vomiting. Negative for abdominal pain.  Genitourinary: Negative for dysuria.  Musculoskeletal: Negative for back pain.  Neurological: Positive for weakness.    Physical Exam Updated Vital Signs BP 116/84 (BP Location: Right Arm)   Pulse 93   Temp 99.3 F (37.4 C) (Oral)   Resp 18   LMP 08/01/2020   SpO2 99%   Physical Exam  ED Results / Procedures / Treatments   Labs (all labs ordered are listed, but only abnormal results are displayed) Labs Reviewed  SARS CORONAVIRUS 2 BY RT PCR (HOSPITAL ORDER, PERFORMED IN [facility])    EKG None  Radiology No results found.  Procedures Procedures (including critical care time)  Medications Ordered in ED  Medications  acetaminophen (TYLENOL) tablet 650 mg (has no administration in time range)    ED Course  I have reviewed the triage vital signs and the nursing notes.  Pertinent labs & imaging results that were available during my care of the patient were reviewed by me and considered in my medical decision making (see chart for details).    MDM Rules/Calculators/A&P                         Patient presents with Covid symptoms.Vitals are stable, oxygen saturations are 99%, patient is not tachypneic or tachycardic.  Complaints of chest pain or shortness of breath.  No visible exudates, erythema, exudates of peritonsillar/retropharyngeal abscess, Ludwig's angina.   COVID test pending. Patient educated on supportive care.  Instructed  her to buy a pulseox if it is positive and return to the ER if her oxygen saturations are below 90%.  Instructed to quarantine for 7 to 10 days.  She voiced understanding and is agreeable. Treated with Tylenol here in the ED.  Return precautions discussed.  Remains hemodynamically stable at discharge.  Dana Edwards was evaluated in Emergency Department on 08/30/2020 for the symptoms described in the history of present illness. She was evaluated in the context of the global COVID-19 pandemic, which necessitated consideration that the patient might be at risk for infection with the SARS-CoV-2 virus that causes COVID-19. Institutional protocols and algorithms that pertain to the evaluation of patients at risk for COVID-19 are in a state of rapid change based on information released by regulatory bodies including the CDC and federal and state organizations. These policies and algorithms were followed during the patient's care in the ED.  Final Clinical Impression(s) / ED Diagnoses Final diagnoses:  Viral illness  Encounter for screening for COVID-19    Rx / DC Orders ED Discharge Orders    None       Mare Ferrari, PA-C 08/30/20 1607    Pricilla Loveless, MD 08/31/20 [phone]

## 2020-08-31 ENCOUNTER — Encounter (HOSPITAL_COMMUNITY): Payer: Self-pay | Admitting: Emergency Medicine

## 2020-08-31 ENCOUNTER — Emergency Department (HOSPITAL_COMMUNITY): Payer: Medicaid Other

## 2020-08-31 ENCOUNTER — Emergency Department (HOSPITAL_COMMUNITY)
Admission: EM | Admit: 2020-08-31 | Discharge: 2020-08-31 | Disposition: A | Discharge status: Home / Self Care | Payer: Medicaid Other | Attending: Emergency Medicine | Admitting: Emergency Medicine

## 2020-08-31 ENCOUNTER — Other Ambulatory Visit: Payer: Self-pay

## 2020-08-31 DIAGNOSIS — I10 Essential (primary) hypertension: Secondary | ICD-10-CM | POA: Diagnosis not present

## 2020-08-31 DIAGNOSIS — U071 COVID-19: Secondary | ICD-10-CM

## 2020-08-31 DIAGNOSIS — R0602 Shortness of breath: Secondary | ICD-10-CM | POA: Diagnosis not present

## 2020-08-31 LAB — CBC WITH DIFFERENTIAL/PLATELET
Abs Immature Granulocytes: 0.01 10*3/uL (ref 0.00–0.07)
Basophils Absolute: 0 10*3/uL (ref 0.0–0.1)
Basophils Relative: 0 %
Eosinophils Absolute: 0 10*3/uL (ref 0.0–0.5)
Eosinophils Relative: 0 %
HCT: 38.5 % (ref 36.0–46.0)
Hemoglobin: 11.8 g/dL — ABNORMAL LOW (ref 12.0–15.0)
Immature Granulocytes: 0 %
Lymphocytes Relative: 32 %
Lymphs Abs: 1 10*3/uL (ref 0.7–4.0)
MCH: 27.1 pg (ref 26.0–34.0)
MCHC: 30.6 g/dL (ref 30.0–36.0)
MCV: 88.5 fL (ref 80.0–100.0)
Monocytes Absolute: 0.4 10*3/uL (ref 0.1–1.0)
Monocytes Relative: 12 %
Neutro Abs: 1.8 10*3/uL (ref 1.7–7.7)
Neutrophils Relative %: 56 %
Platelets: 214 10*3/uL (ref 150–400)
RBC: 4.35 MIL/uL (ref 3.87–5.11)
RDW: 15.7 % — ABNORMAL HIGH (ref 11.5–15.5)
WBC: 3.2 10*3/uL — ABNORMAL LOW (ref 4.0–10.5)
nRBC: 0 % (ref 0.0–0.2)

## 2020-08-31 LAB — BASIC METABOLIC PANEL
Anion gap: 10 (ref 5–15)
BUN: 7 mg/dL (ref 6–20)
CO2: 24 mmol/L (ref 22–32)
Calcium: 8.7 mg/dL — ABNORMAL LOW (ref 8.9–10.3)
Chloride: 105 mmol/L (ref 98–111)
Creatinine, Ser: 0.92 mg/dL (ref 0.44–1.00)
GFR calc Af Amer: >60 mL/min (ref >60)
GFR calc non Af Amer: >60 mL/min (ref >60)
Glucose, Bld: 84 mg/dL (ref 70–99)
Potassium: 3.9 mmol/L (ref 3.5–5.1)
Sodium: 139 mmol/L (ref 135–145)

## 2020-08-31 LAB — TROPONIN I (HIGH SENSITIVITY): Troponin I (High Sensitivity): <2 ng/L (ref <18)

## 2020-08-31 MED ORDER — BENZONATATE 100 MG PO CAPS: 100.0000 mg | ORAL_CAPSULE | Freq: Three times a day (TID) | ORAL | 0 refills | Status: AC

## 2020-08-31 NOTE — Discharge Instructions (Addendum)
At this time there does not appear to be the presence of an emergent medical condition, however there is always the potential for conditions to change. Please read and follow the below instructions.  Please return to the Emergency Department immediately for any new or worsening symptoms. Please be sure to follow up with your Primary Care Provider within one week regarding your visit today; please call their office to schedule an appointment even if you are feeling better for a follow-up visit. You were referred to the monoclonal antibody infusion center.  Please keep your phone on you.  They should call you tomorrow to schedule a time for infusion.  The monoclonal antibody infusion may be beneficial to help treat your COVID-19 viral infection.  They may call from an unknown number so be sure to answer your phone tomorrow. You may use over-the-counter anti-inflammatory such as Tylenol as instructed on the packaging to help with your symptoms. You may use the medication Tessalon as prescribed to help if you develop cough. You may use over-the-counter saline rinses and other products such as Veneta Penton to help with nasal congestion.  Go immediately to the nearest emergency department if:  You have trouble breathing. You have pain or pressure in your chest. You have confusion. You have bluish lips and fingernails. You have difficulty waking from sleep. You have any new/concerning or worsening of symptoms These symptoms may represent a serious problem that is an emergency. Do not wait to see if the symptoms will go away. Get medical help right away. Call your local emergency services (911 in the [place] Do not drive yourself to the hospital. Let the emergency medical personnel know if you think you have COVID-19.  Please read the additional information packets attached to your discharge summary.  Do not take your medicine if  develop an itchy rash, swelling in your mouth or lips, or difficulty  breathing; call 911 and seek immediate emergency medical attention if this occurs.  You may review your lab tests and imaging results in their entirety on your MyChart account.  Please discuss all results of fully with your primary care provider and other specialist at your follow-up visit.  Note: Portions of this text may have been transcribed using voice recognition software. Every effort was made to ensure accuracy; however, inadvertent computerized transcription errors may still be present.

## 2020-08-31 NOTE — ED Notes (Signed)
Pt ambulated and oxygen level maintained at 97%. She complains she cannot breathe. Has nasal congestion.

## 2020-08-31 NOTE — ED Triage Notes (Signed)
Pt returns today for reports of worsening shob, pt seen yesterday after being seen for shob cough related to COVID. Pt states she was confused regarding discharge process and she thought she was getting pneumonia pills, ibuprofen and steroids. Pt states she needs to ease her breathing effort.  Pt states she did take OTC Ibuprofen but has had no relief.

## 2020-08-31 NOTE — ED Provider Notes (Addendum)
[facility] EMERGENCY DEPARTMENT Provider Note   CSN: [id number] Arrival date & time: 08/31/20  1356     History Chief Complaint  Patient presents with  . Covid +    Dana Edwards is a 34 y.o. female history of GERD, hypertension, cholecystectomy.  Patient presents today on fourth day of symptoms.  She reports 4 days ago she developed headache, generalized body aches, tactile fevers, generalized weakness/fatigue.  She came to the emergency department yesterday tested positive for COVID-19.  She was then discharged.  She reports shortly after getting home she developed some shortness of breath and chest tightness.  She describes shortness of breath as difficulty taking a deep breath without pleurisy.  She reports some mild intermittent chest tightness as well, nonradiating, generalized no aggravating or alleviating factors.   Denies fall/injury, headache, neck stiffness, sore throat, hemoptysis, abdominal pain, vomiting, diarrhea, pain on exertion, extremity swelling/color change, history of blood clot, exogenous hormone use, recent surgery/immobilization, history of cancer or any additional concerns.  Patient is unvaccinated against COVID-19.  HPI     Past Medical History:  Diagnosis Date  . Cholelithiasis   . GERD (gastroesophageal reflux disease)   . Hypertension    hx of 2016 but not on meds now     Patient Active Problem List   Diagnosis Date Noted  . Stillbirth 05/02/2019  . VBAC (vaginal birth after Cesarean) 09/21/2015    Past Surgical History:  Procedure Laterality Date  . CESAREAN SECTION  2007  . CHOLECYSTECTOMY N/A 10/17/2016   Procedure: LAPAROSCOPIC CHOLECYSTECTOMY WITH INTRAOPERATIVE CHOLANGIOGRAM;  Surgeon: Gaynelle Adu, MD;  Location: [facility];  Service: General;  Laterality: N/A;     OB History    Gravida  4   Para  3   Term  3   Preterm  0   AB  0   Living  3     SAB  0   TAB  0   Ectopic  0   Multiple  0   Live  Births  3           No family history on file.  Social History   Tobacco Use  . Smoking status: Never Smoker  . Smokeless tobacco: Never Used  Substance Use Topics  . Alcohol use: No    Alcohol/week: 0.0 standard drinks  . Drug use: No    Home Medications Prior to Admission medications   Medication Sig Start Date End Date Taking? Authorizing Provider  benzonatate (TESSALON) 100 MG capsule Take 1 capsule (100 mg total) by mouth every 8 (eight) hours. 08/31/20   Harlene Salts A, PA-C  fluticasone (FLONASE) 50 MCG/ACT nasal spray Place 1 spray into both nostrils daily. 05/09/18   Alene Mires, NP  ibuprofen (ADVIL) 600 MG tablet Take 1 tablet (600 mg total) by mouth every 6 (six) hours as needed. 05/02/19   Raelyn Mora, CNM    Allergies    Patient has no known allergies.  Review of Systems   Review of Systems Ten systems are reviewed and are negative for acute change except as noted in the HPI  Physical Exam Updated Vital Signs BP (!) 111/94 (BP Location: Right Arm)   Pulse 90   Temp 98.8 F (37.1 C) (Oral)   Resp (!) 22   Ht 5\' 7"  (1.702 m)   Wt 114 kg   LMP 08/01/2020   SpO2 100%   BMI 39.36 kg/m   Physical Exam Constitutional:  General: She is not in acute distress.    Appearance: Normal appearance. She is well-developed. She is not ill-appearing or diaphoretic.  HENT:     Head: Normocephalic and atraumatic.     Jaw: There is normal jaw occlusion.     Nose: Congestion and rhinorrhea present. Rhinorrhea is clear.     Right Sinus: No maxillary sinus tenderness or frontal sinus tenderness.     Left Sinus: No maxillary sinus tenderness or frontal sinus tenderness.     Mouth/Throat:     Mouth: Mucous membranes are moist.     Pharynx: Oropharynx is clear.  Eyes:     General: Vision grossly intact. Gaze aligned appropriately.     Pupils: Pupils are equal, round, and reactive to light.  Neck:     Trachea: Trachea and phonation normal.    Cardiovascular:     Rate and Rhythm: Normal rate and regular rhythm.  Pulmonary:     Effort: Pulmonary effort is normal. No accessory muscle usage or respiratory distress.     Breath sounds: Normal breath sounds and air entry.  Abdominal:     General: There is no distension.     Palpations: Abdomen is soft.     Tenderness: There is no abdominal tenderness. There is no guarding or rebound.  Musculoskeletal:        General: Normal range of motion.     Cervical back: Normal range of motion.     Right lower leg: No edema.     Left lower leg: No edema.  Skin:    General: Skin is warm and dry.  Neurological:     Mental Status: She is alert.     GCS: GCS eye subscore is 4. GCS verbal subscore is 5. GCS motor subscore is 6.     Comments: Speech is clear and goal oriented, follows commands Major Cranial nerves without deficit, no facial droop Moves extremities without ataxia, coordination intact  Psychiatric:        Behavior: Behavior normal.     ED Results / Procedures / Treatments   Labs (all labs ordered are listed, but only abnormal results are displayed) Labs Reviewed  CBC WITH DIFFERENTIAL/PLATELET - Abnormal; Notable for the following components:      Result Value   WBC 3.2 (*)    Hemoglobin 11.8 (*)    RDW 15.7 (*)    All other components within normal limits  BASIC METABOLIC PANEL - Abnormal; Notable for the following components:   Calcium 8.7 (*)    All other components within normal limits  TROPONIN I (HIGH SENSITIVITY)    EKG EKG Interpretation  Date/Time:  Thursday August 31 2020 18:34:12 EDT Ventricular Rate:  90 PR Interval:  138 QRS Duration: 80 QT Interval:  370 QTC Calculation: 452 R Axis:   35 Text Interpretation: Normal sinus rhythm Nonspecific T wave abnormality Confirmed by Cathren Laine ([id number]) on 08/31/2020 6:38:27 PM   Radiology DG Chest Portable 1 View  Result Date: 08/31/2020 CLINICAL DATA:  34 year old female with history of COVID-19  presenting with shortness of breath. EXAM: PORTABLE CHEST 1 VIEW COMPARISON:  05/10/2016 FINDINGS: The heart size and mediastinal contours are within normal limits. Low lung volumes. No focal consolidations. No pleural effusion or pneumothorax. The visualized upper abdominal and skeletal structures are unremarkable. IMPRESSION: No acute cardiopulmonary process. Electronically Signed   By: Marliss Coots MD   On: 08/31/2020 17:00    Procedures Procedures (including critical care time)  Medications Ordered in ED  Medications - No data to display  ED Course  I have reviewed the triage vital signs and the nursing notes.  Pertinent labs & imaging results that were available during my care of the patient were reviewed by me and considered in my medical decision making (see chart for details).    MDM Rules/Calculators/A&P                         Additional history obtained from: 1. Nursing notes from this visit. 2. Electronic medical record reviewed.  Patient was seen in the ER yesterday, had a diagnosis of viral illness and encounter for screening for COVID-19.  She had a positive Covid test yesterday.  No hypoxia yesterday.  She was given precautions and encouraged at home symptomatic treatments. --------------------------- I ordered, reviewed and interpreted labs which include: CBC with mild leukopenia of 3.2, likely secondary to COVID-19 viral infection.  Mild anemia of 11.8 improved from last year. BMP shows no emergent electrolyte derangement, AKI or gap. High-sensitivity troponin negative, symptoms began last night, no indication for delta troponin.  Doubt ACS.  Patient low risk by [place] PERC negative, doubt pulmonary embolism.  Chest x-ray:  IMPRESSION:  No acute cardiopulmonary process.   EKG: Normal sinus rhythm Nonspecific T wave abnormality Confirmed by Cathren LaineSteinl, Kevin ([id number]) on 08/31/2020 6:38:27 PM  Patient ambulated by nursing staff without hypoxia on room air.  I suspect most  of patient's respiratory complaints are upper airway is secondary to the large amount of rhinorrhea and nasal congestion. No evidence of bacterial sinusitis. No indication for antibiotics. Patient's symptoms likely secondary to COVID-19 viral infection. No evidence for ACS, PE, dissection, bacterial pneumonia or other emergent cardiopulmonary processes at this time.  I have referred patient to monoclonal antibody infusion center, believe she will qualify based on BMI.  I sent message to secure chat [facility] to schedule infusion.  At this time there does not appear to be any evidence of an acute emergency medical condition and the patient appears stable for discharge with appropriate outpatient follow up. Diagnosis was discussed with patient who verbalizes understanding of care plan and is agreeable to discharge. I have discussed return precautions with patient who verbalizes understanding. Patient encouraged to follow-up with their PCP. All questions answered.  Patient's case discussed with Dr. Denton LankSteinl who agrees with plan to discharge with follow-up.   Dana Edwards was evaluated in Emergency Department on 08/31/2020 for the symptoms described in the history of present illness. She was evaluated in the context of the global COVID-19 pandemic, which necessitated consideration that the patient might be at risk for infection with the SARS-CoV-2 virus that causes COVID-19. Institutional protocols and algorithms that pertain to the evaluation of patients at risk for COVID-19 are in a state of rapid change based on information released by regulatory bodies including the CDC and federal and state organizations. These policies and algorithms were followed during the patient's care in the ED.   Note: Portions of this report may have been transcribed using voice recognition software. Every effort was made to ensure accuracy; however, inadvertent computerized transcription errors may still be present. Final  Clinical Impression(s) / ED Diagnoses Final diagnoses:  COVID-19 virus infection    Rx / DC Orders ED Discharge Orders         Ordered    benzonatate (TESSALON) 100 MG capsule  Every 8 hours        08/31/20 1841  Bill Salinas, PA-C 08/31/20 1842    Bill Salinas, PA-C 08/31/20 1859    Cathren Laine, MD 09/01/20 [phone]

## 2020-09-04 ENCOUNTER — Emergency Department (HOSPITAL_COMMUNITY): Payer: Medicaid Other

## 2020-09-04 ENCOUNTER — Other Ambulatory Visit: Payer: Self-pay

## 2020-09-04 ENCOUNTER — Encounter (HOSPITAL_COMMUNITY): Payer: Self-pay

## 2020-09-04 ENCOUNTER — Emergency Department (HOSPITAL_COMMUNITY)
Admission: EM | Admit: 2020-09-04 | Discharge: 2020-09-05 | Disposition: A | Discharge status: Home / Self Care | Payer: Medicaid Other | Attending: Emergency Medicine | Admitting: Emergency Medicine

## 2020-09-04 DIAGNOSIS — Z8616 Personal history of COVID-19: Secondary | ICD-10-CM | POA: Diagnosis not present

## 2020-09-04 DIAGNOSIS — R519 Headache, unspecified: Secondary | ICD-10-CM | POA: Diagnosis not present

## 2020-09-04 DIAGNOSIS — R531 Weakness: Secondary | ICD-10-CM | POA: Diagnosis not present

## 2020-09-04 DIAGNOSIS — R6883 Chills (without fever): Secondary | ICD-10-CM | POA: Diagnosis not present

## 2020-09-04 DIAGNOSIS — I10 Essential (primary) hypertension: Secondary | ICD-10-CM | POA: Insufficient documentation

## 2020-09-04 DIAGNOSIS — R079 Chest pain, unspecified: Secondary | ICD-10-CM | POA: Diagnosis not present

## 2020-09-04 DIAGNOSIS — R5383 Other fatigue: Secondary | ICD-10-CM | POA: Insufficient documentation

## 2020-09-04 DIAGNOSIS — U071 COVID-19: Secondary | ICD-10-CM

## 2020-09-04 DIAGNOSIS — R0602 Shortness of breath: Secondary | ICD-10-CM | POA: Insufficient documentation

## 2020-09-04 DIAGNOSIS — J189 Pneumonia, unspecified organism: Secondary | ICD-10-CM | POA: Diagnosis not present

## 2020-09-04 LAB — BASIC METABOLIC PANEL
Anion gap: 11 (ref 5–15)
BUN: 9 mg/dL (ref 6–20)
CO2: 24 mmol/L (ref 22–32)
Calcium: 8.4 mg/dL — ABNORMAL LOW (ref 8.9–10.3)
Chloride: 101 mmol/L (ref 98–111)
Creatinine, Ser: 0.9 mg/dL (ref 0.44–1.00)
GFR calc Af Amer: >60 mL/min (ref >60)
GFR calc non Af Amer: >60 mL/min (ref >60)
Glucose, Bld: 86 mg/dL (ref 70–99)
Potassium: 3.1 mmol/L — ABNORMAL LOW (ref 3.5–5.1)
Sodium: 136 mmol/L (ref 135–145)

## 2020-09-04 LAB — I-STAT BETA HCG BLOOD, ED (MC, WL, AP ONLY): I-stat hCG, quantitative: <5 m[IU]/mL (ref <5)

## 2020-09-04 LAB — CBC
HCT: 38.9 % (ref 36.0–46.0)
Hemoglobin: 12.7 g/dL (ref 12.0–15.0)
MCH: 27.9 pg (ref 26.0–34.0)
MCHC: 32.6 g/dL (ref 30.0–36.0)
MCV: 85.5 fL (ref 80.0–100.0)
Platelets: 151 10*3/uL (ref 150–400)
RBC: 4.55 MIL/uL (ref 3.87–5.11)
RDW: 15.3 % (ref 11.5–15.5)
WBC: 3.8 10*3/uL — ABNORMAL LOW (ref 4.0–10.5)
nRBC: 0 % (ref 0.0–0.2)

## 2020-09-04 MED ORDER — LORAZEPAM 0.5 MG PO TABS
0.5000 mg | ORAL_TABLET | Freq: Once | ORAL | Status: AC
  Administered 2020-09-04: 0.5 mg via ORAL
  Filled 2020-09-04: qty 1

## 2020-09-04 MED ORDER — EPINEPHRINE 0.3 MG/0.3ML IJ SOAJ: 0.3000 mg | Freq: Once | INTRAMUSCULAR | Status: DC | PRN

## 2020-09-04 MED ORDER — METHYLPREDNISOLONE SODIUM SUCC 125 MG IJ SOLR: 125.0000 mg | Freq: Once | INTRAMUSCULAR | Status: DC | PRN

## 2020-09-04 MED ORDER — ALBUTEROL SULFATE HFA 108 (90 BASE) MCG/ACT IN AERS: 2.0000 | INHALATION_SPRAY | Freq: Once | RESPIRATORY_TRACT | Status: DC | PRN

## 2020-09-04 MED ORDER — FAMOTIDINE IN NACL 20-0.9 MG/50ML-% IV SOLN: 20.0000 mg | Freq: Once | INTRAVENOUS | Status: DC | PRN

## 2020-09-04 MED ORDER — IBUPROFEN 800 MG PO TABS
800.0000 mg | ORAL_TABLET | Freq: Once | ORAL | Status: AC
  Administered 2020-09-04: 800 mg via ORAL
  Filled 2020-09-04: qty 1

## 2020-09-04 MED ORDER — DIPHENHYDRAMINE HCL 50 MG/ML IJ SOLN: 50.0000 mg | Freq: Once | INTRAMUSCULAR | Status: DC | PRN

## 2020-09-04 MED ORDER — SODIUM CHLORIDE 0.9 % IV SOLN
Freq: Once | INTRAVENOUS | Status: AC
  Filled 2020-09-04: qty 20

## 2020-09-04 MED ORDER — SODIUM CHLORIDE 0.9 % IV SOLN: INTRAVENOUS | Status: DC | PRN

## 2020-09-04 MED ORDER — POTASSIUM CHLORIDE CRYS ER 20 MEQ PO TBCR
40.0000 meq | EXTENDED_RELEASE_TABLET | Freq: Once | ORAL | Status: AC
  Administered 2020-09-04: 40 meq via ORAL
  Filled 2020-09-04: qty 2

## 2020-09-04 MED ORDER — DEXAMETHASONE SODIUM PHOSPHATE 10 MG/ML IJ SOLN
10.0000 mg | Freq: Once | INTRAMUSCULAR | Status: AC
  Administered 2020-09-04: 10 mg via INTRAVENOUS
  Filled 2020-09-04: qty 1

## 2020-09-04 MED ORDER — SODIUM CHLORIDE 0.9 % IV SOLN: 1200.0000 mg | Freq: Once | INTRAVENOUS | Status: DC

## 2020-09-04 NOTE — ED Notes (Signed)
Attempted to ambulate pt with pulse ox, pt asked for "5 more minutes" due to pain. Bedside commode at bedside and call bell within reach. Provider aware

## 2020-09-04 NOTE — ED Provider Notes (Signed)
[facility]-EMERGENCY DEPT Provider Note   CSN: [id number] Arrival date & time: 09/04/20  1710     History Chief Complaint  Patient presents with  . Shortness of Breath    Dana Edwards is a 34 y.o. female who  has a past medical history of Cholelithiasis, GERD (gastroesophageal reflux disease), and Hypertension. She presents with cc shortness of breath.  Patient was diagnosed with and Covid on 08/30/2020.  Patient symptoms started 8 days ago.  She was seen in the emergency department on the 23rd due to generalized body aches weakness and fatigue.  Today she presents with the same symptoms but states that she is feeling very short of breath.  She complains of chest pain body aches chills and headache.  She states that she is feeling very weak.  Her last oral antipyretic was over 8 hours ago.  She denies nausea vomiting or diarrhea. HPI     Past Medical History:  Diagnosis Date  . Cholelithiasis   . GERD (gastroesophageal reflux disease)   . Hypertension    hx of 2016 but not on meds now     Patient Active Problem List   Diagnosis Date Noted  . Stillbirth 05/02/2019  . VBAC (vaginal birth after Cesarean) 09/21/2015    Past Surgical History:  Procedure Laterality Date  . CESAREAN SECTION  2007  . CHOLECYSTECTOMY N/A 10/17/2016   Procedure: LAPAROSCOPIC CHOLECYSTECTOMY WITH INTRAOPERATIVE CHOLANGIOGRAM;  Surgeon: Gaynelle Adu, MD;  Location: [facility] OR[facility];  Service: General;  Laterality: N/A;     OB History    Gravida  4   Para  3   Term  3   Preterm  0   AB  0   Living  3     SAB  0   TAB  0   Ectopic  0   Multiple  0   Live Births  3           No family history on file.  Social History   Tobacco Use  . Smoking status: Never Smoker  . Smokeless tobacco: Never Used  Substance Use Topics  . Alcohol use: No    Alcohol/week: 0.0 standard drinks  . Drug use: No    Home Medications Prior to Admission medications   Medication  Sig Start Date End Date Taking? Authorizing Provider  benzonatate (TESSALON) 100 MG capsule Take 1 capsule (100 mg total) by mouth every 8 (eight) hours. 08/31/20   Harlene Salts A, PA-C  fluticasone (FLONASE) 50 MCG/ACT nasal spray Place 1 spray into both nostrils daily. 05/09/18   Alene Mires, NP  ibuprofen (ADVIL) 600 MG tablet Take 1 tablet (600 mg total) by mouth every 6 (six) hours as needed. 05/02/19   Raelyn Mora, CNM    Allergies    Patient has no known allergies.  Review of Systems   Review of Systems Ten systems reviewed and are negative for acute change, except as noted in the HPI. ] Physical Exam Updated Vital Signs BP 111/86   Pulse 95   Temp 99.1 F (37.3 C) (Oral)   Resp (!) 24   SpO2 100%   Physical Exam Vitals and nursing note reviewed.  Constitutional:      General: She is not in acute distress.    Appearance: She is well-developed. She is ill-appearing. She is not toxic-appearing or diaphoretic.  HENT:     Head: Normocephalic and atraumatic.  Eyes:     General: No scleral icterus.  Conjunctiva/sclera: Conjunctivae normal.  Cardiovascular:     Rate and Rhythm: Normal rate and regular rhythm.     Heart sounds: Normal heart sounds. No murmur heard.  No friction rub. No gallop.   Pulmonary:     Effort: Pulmonary effort is normal. Tachypnea present. No respiratory distress.     Breath sounds: Normal breath sounds. No wheezing, rhonchi or rales.  Abdominal:     General: Bowel sounds are normal. There is no distension.     Palpations: Abdomen is soft. There is no mass.     Tenderness: There is no abdominal tenderness. There is no guarding.  Musculoskeletal:     Cervical back: Normal range of motion.  Skin:    General: Skin is warm and dry.  Neurological:     Mental Status: She is alert and oriented to person, place, and time.  Psychiatric:        Behavior: Behavior normal.     ED Results / Procedures / Treatments   Labs (all labs  ordered are listed, but only abnormal results are displayed) Labs Reviewed  BASIC METABOLIC PANEL - Abnormal; Notable for the following components:      Result Value   Potassium 3.1 (*)    Calcium 8.4 (*)    All other components within normal limits  CBC - Abnormal; Notable for the following components:   WBC 3.8 (*)    All other components within normal limits  I-STAT BETA HCG BLOOD, ED ([facility], [facility], [facility] ONLY)    EKG EKG Interpretation  Date/Time:  Monday September 04 2020 17:25:10 EDT Ventricular Rate:  98 PR Interval:    QRS Duration: 75 QT Interval:  340 QTC Calculation: 435 R Axis:   31 Text Interpretation: Sinus rhythm Borderline T wave abnormalities No significant change since last tracing Confirmed by Susy Frizzle [phone]) on 09/04/2020 5:30:17 PM   Radiology DG Chest Port 1 View  Result Date: 09/04/2020 CLINICAL DATA:  Worsening shortness of breath and chest pain EXAM: PORTABLE CHEST 1 VIEW COMPARISON:  August 31, 2020 FINDINGS: Cardiomediastinal contours and hilar structures are normal. Lung volumes are diminished compared to the prior study. Nodular opacity in the LEFT lung base new compared to prior chest radiograph. No sign of effusion. On limited assessment no acute skeletal process. IMPRESSION: New nodular opacity in the LEFT lung base, may represent developing pneumonia. Small nodule not excluded though not seen previously. Suggest attention on follow-up. Electronically Signed   By: Donzetta Kohut M.D.   On: 09/04/2020 18:33    Procedures Procedures (including critical care time)  Medications Ordered in ED Medications  ibuprofen (ADVIL) tablet 800 mg (800 mg Oral Given 09/04/20 1855)    ED Course  I have reviewed the triage vital signs and the nursing notes.  Pertinent labs & imaging results that were available during my care of the patient were reviewed by me and considered in my medical decision making (see chart for details).    MDM  Rules/Calculators/A&P                          Patient here with CoVID. SHe has been afebrile and without hypoxia. She is tachypneic, however, when distracted, she seems to breathe at a normal rate and, though I have no doubt she feels horrible, I belief she may be putting on a bit, or overly anxious about her sxs. The patient was given motrin,tylenol, decadron and MAB infusion. She is appropriate for discharge  at this time.  Dana Edwards was evaluated in Emergency Department on 09/06/2020 for the symptoms described in the history of present illness. She was evaluated in the context of the global COVID-19 pandemic, which necessitated consideration that the patient might be at risk for infection with the SARS-CoV-2 virus that causes COVID-19. Institutional protocols and algorithms that pertain to the evaluation of patients at risk for COVID-19 are in a state of rapid change based on information released by regulatory bodies including the CDC and federal and state organizations. These policies and algorithms were followed during the patient's care in the ED.  Final Clinical Impression(s) / ED Diagnoses Final diagnoses:  Chest pain    Rx / DC Orders ED Discharge Orders    None       Arthor Captain, PA-C 09/06/20 1259    Terrilee Files, MD 09/07/20 1121

## 2020-09-04 NOTE — ED Triage Notes (Signed)
Pt coming in c/o worsening shob, chest pain, n/v/d, and body aches. + covid for 9/22. Ambulatory and on room air

## 2020-09-04 NOTE — ED Notes (Signed)
Pt ambulated and SpO2 remained at 100% room air. Provider at bedside during exam

## 2020-09-04 NOTE — ED Notes (Addendum)
Went in pt room to check in on her. Pt claims she wants to be "transferred to [facility]." Pt states she feels like "she is not being taken care of properly here." Pt was given three warm blankets. Pt also claims that she "cannot breathe but the monitor says im  fine but I am not. I don't want to be here."

## 2020-09-04 NOTE — Discharge Instructions (Addendum)
Person Under Monitoring Name: Dana Edwards  Location: [address] [place] [address]   Infection Prevention Recommendations for Individuals Confirmed to have, or Being Evaluated for, 2019 Novel Coronavirus (COVID-19) Infection Who Receive Care at Home  Individuals who are confirmed to have, or are being evaluated for, COVID-19 should follow the prevention steps below until a healthcare provider or local or state health department says they can return to normal activities.  Stay home except to get medical care You should restrict activities outside your home, except for getting medical care. Do not go to work, school, or public areas, and do not use public transportation or taxis.  Call ahead before visiting your doctor Before your medical appointment, call the healthcare provider and tell them that you have, or are being evaluated for, COVID-19 infection. This will help the healthcare provider's office take steps to keep other people from getting infected. Ask your healthcare provider to call the local or state health department.  Monitor your symptoms Seek prompt medical attention if your illness is worsening (e.g., difficulty breathing). Before going to your medical appointment, call the healthcare provider and tell them that you have, or are being evaluated for, COVID-19 infection. Ask your healthcare provider to call the local or state health department.  Wear a facemask You should wear a facemask that covers your nose and mouth when you are in the same room with other people and when you visit a healthcare provider. People who live with or visit you should also wear a facemask while they are in the same room with you.  Separate yourself from other people in your home As much as possible, you should stay in a different room from other people in your home. Also, you should use a separate bathroom, if available.  Avoid sharing household items You should not  share dishes, drinking glasses, cups, eating utensils, towels, bedding, or other items with other people in your home. After using these items, you should wash them thoroughly with soap and water.  Cover your coughs and sneezes Cover your mouth and nose with a tissue when you cough or sneeze, or you can cough or sneeze into your sleeve. Throw used tissues in a lined trash can, and immediately wash your hands with soap and water for at least 20 seconds or use an alcohol-based hand rub.  Wash your [employer] your hands often and thoroughly with soap and water for at least 20 seconds. You can use an alcohol-based hand sanitizer if soap and water are not available and if your hands are not visibly dirty. Avoid touching your eyes, nose, and mouth with unwashed hands.   Prevention Steps for Caregivers and Household Members of Individuals Confirmed to have, or Being Evaluated for, COVID-19 Infection Being Cared for in the Home  If you live with, or provide care at home for, a person confirmed to have, or being evaluated for, COVID-19 infection please follow these guidelines to prevent infection:  Follow healthcare provider's instructions Make sure that you understand and can help the patient follow any healthcare provider instructions for all care.  Provide for the patient's basic needs You should help the patient with basic needs in the home and provide support for getting groceries, prescriptions, and other personal needs.  Monitor the patient's symptoms If they are getting sicker, call his or her medical provider and tell them that the patient has, or is being evaluated for, COVID-19 infection. This will help the healthcare  provider's office take steps to keep other people from getting infected. Ask the healthcare provider to call the local or state health department.  Limit the number of people who have contact with the patient If possible, have only one caregiver for the  patient. Other household members should stay in another home or place of residence. If this is not possible, they should stay in another room, or be separated from the patient as much as possible. Use a separate bathroom, if available. Restrict visitors who do not have an essential need to be in the home.  Keep older adults, very young children, and other sick people away from the patient Keep older adults, very young children, and those who have compromised immune systems or chronic health conditions away from the patient. This includes people with chronic heart, lung, or kidney conditions, diabetes, and cancer.  Ensure good ventilation Make sure that shared spaces in the home have good air flow, such as from an air conditioner or an opened window, weather permitting.  Wash your hands often Wash your hands often and thoroughly with soap and water for at least 20 seconds. You can use an alcohol based hand sanitizer if soap and water are not available and if your hands are not visibly dirty. Avoid touching your eyes, nose, and mouth with unwashed hands. Use disposable paper towels to dry your hands. If not available, use dedicated cloth towels and replace them when they become wet.  Wear a facemask and gloves Wear a disposable facemask at all times in the room and gloves when you touch or have contact with the patient's blood, body fluids, and/or secretions or excretions, such as sweat, saliva, sputum, nasal mucus, vomit, urine, or feces.  Ensure the mask fits over your nose and mouth tightly, and do not touch it during use. Throw out disposable facemasks and gloves after using them. Do not reuse. Wash your hands immediately after removing your facemask and gloves. If your personal clothing becomes contaminated, carefully remove clothing and launder. Wash your hands after handling contaminated clothing. Place all used disposable facemasks, gloves, and other waste in a lined container before  disposing them with other household waste. Remove gloves and wash your hands immediately after handling these items.  Do not share dishes, glasses, or other household items with the patient Avoid sharing household items. You should not share dishes, drinking glasses, cups, eating utensils, towels, bedding, or other items with a patient who is confirmed to have, or being evaluated for, COVID-19 infection. After the person uses these items, you should wash them thoroughly with soap and water.  Wash laundry thoroughly Immediately remove and wash clothes or bedding that have blood, body fluids, and/or secretions or excretions, such as sweat, saliva, sputum, nasal mucus, vomit, urine, or feces, on them. Wear gloves when handling laundry from the patient. Read and follow directions on labels of laundry or clothing items and detergent. In general, wash and dry with the warmest temperatures recommended on the label.  Clean all areas the individual has used often Clean all touchable surfaces, such as counters, tabletops, doorknobs, bathroom fixtures, toilets, phones, keyboards, tablets, and bedside tables, every day. Also, clean any surfaces that may have blood, body fluids, and/or secretions or excretions on them. Wear gloves when cleaning surfaces the patient has come in contact with. Use a diluted bleach solution (e.g., dilute bleach with 1 part bleach and 10 parts water) or a household disinfectant with a label that says EPA-registered for coronaviruses. To make  a bleach solution at home, add 1 tablespoon of bleach to 1 quart (4 cups) of water. For a larger supply, add  cup of bleach to 1 gallon (16 cups) of water. Read labels of cleaning products and follow recommendations provided on product labels. Labels contain instructions for safe and effective use of the cleaning product including precautions you should take when applying the product, such as wearing gloves or eye protection and making sure you  have good ventilation during use of the product. Remove gloves and wash hands immediately after cleaning.  Monitor yourself for signs and symptoms of illness Caregivers and household members are considered close contacts, should monitor their health, and will be asked to limit movement outside of the home to the extent possible. Follow the monitoring steps for close contacts listed on the symptom monitoring form.   ? If you have additional questions, contact your local health department or call the epidemiologist on call at [phone] (available 24/7). ? This guidance is subject to change. For the most up-to-date guidance from [facility], please refer to their website: YouBlogs.pl

## 2020-09-04 NOTE — ED Notes (Signed)
Rn spoke with pt. Pt upset due to being "extremely cold" and wanting "to be transferred to [facility]." Rn explained the process. Immediate needs addressed. Medication administered. Pt stated that she "is too weak" and will stay unless we can transfer her to [facility]. SpO2 is 100% room air.

## 2020-09-05 ENCOUNTER — Telehealth: Payer: Self-pay | Admitting: *Deleted

## 2020-09-05 NOTE — ED Notes (Signed)
During discharge, pt expressed issues with the care she received after 11pm. Pt given information on how to follow up with concerns. Pt also verbalized dc instructions and follow up care regarding covid. Alert and assisted out using wheelchair. Pt ambulated without assistance and a steady gait prior to getting in wheelchair. Vitals stable prior to discharge. No iv. Has a ride home.

## 2020-09-05 NOTE — Telephone Encounter (Signed)
TOC CM received call from patient inquiring about injection. Explained she did receive injection. Seek medical attention if symptoms persist or worsen. Isidoro Donning RN CCM, [facility] ED TOC CM [phone]

## 2021-07-03 ENCOUNTER — Other Ambulatory Visit: Payer: Self-pay

## 2021-07-03 ENCOUNTER — Encounter (HOSPITAL_COMMUNITY): Payer: Self-pay

## 2021-07-03 ENCOUNTER — Ambulatory Visit (HOSPITAL_COMMUNITY)
Admission: EM | Admit: 2021-07-03 | Discharge: 2021-07-03 | Disposition: A | Discharge status: Home / Self Care | Payer: Medicaid Other | Attending: Internal Medicine | Admitting: Internal Medicine

## 2021-07-03 ENCOUNTER — Ambulatory Visit (INDEPENDENT_AMBULATORY_CARE_PROVIDER_SITE_OTHER): Payer: Medicaid Other

## 2021-07-03 DIAGNOSIS — M549 Dorsalgia, unspecified: Secondary | ICD-10-CM | POA: Diagnosis not present

## 2021-07-03 DIAGNOSIS — M47814 Spondylosis without myelopathy or radiculopathy, thoracic region: Secondary | ICD-10-CM | POA: Diagnosis not present

## 2021-07-03 DIAGNOSIS — M546 Pain in thoracic spine: Secondary | ICD-10-CM

## 2021-07-03 DIAGNOSIS — Z041 Encounter for examination and observation following transport accident: Secondary | ICD-10-CM | POA: Diagnosis not present

## 2021-07-03 DIAGNOSIS — M542 Cervicalgia: Secondary | ICD-10-CM

## 2021-07-03 DIAGNOSIS — M47812 Spondylosis without myelopathy or radiculopathy, cervical region: Secondary | ICD-10-CM | POA: Diagnosis not present

## 2021-07-03 DIAGNOSIS — M545 Low back pain, unspecified: Secondary | ICD-10-CM | POA: Diagnosis not present

## 2021-07-03 MED ORDER — IBUPROFEN 600 MG PO TABS: 600.0000 mg | ORAL_TABLET | Freq: Four times a day (QID) | ORAL | 0 refills | Status: AC | PRN

## 2021-07-03 NOTE — Discharge Instructions (Addendum)
Please take prescription ibuprofen as needed for pain.  Please not take any additional over-the-counter ibuprofen, Advil, Aleve.  Alternate ice and heat application to affected area of pain.  Please use contact information that was provided for [facility] orthopedic sports medicine if pain does not resolve in the next 1 to 2 weeks.
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
## 2021-07-05 DIAGNOSIS — S29012A Strain of muscle and tendon of back wall of thorax, initial encounter: Secondary | ICD-10-CM | POA: Diagnosis not present

## 2021-07-05 DIAGNOSIS — S39012D Strain of muscle, fascia and tendon of lower back, subsequent encounter: Secondary | ICD-10-CM | POA: Diagnosis not present

## 2021-07-05 DIAGNOSIS — S161XXD Strain of muscle, fascia and tendon at neck level, subsequent encounter: Secondary | ICD-10-CM | POA: Diagnosis not present

## 2021-07-12 ENCOUNTER — Ambulatory Visit: Payer: Medicaid Other | Admitting: Surgery
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
## 2023-09-10 ENCOUNTER — Ambulatory Visit (HOSPITAL_COMMUNITY)
Admission: EM | Admit: 2023-09-10 | Discharge: 2023-09-10 | Disposition: A | Discharge status: Home / Self Care | Payer: Medicaid Other

## 2023-09-10 ENCOUNTER — Other Ambulatory Visit: Payer: Self-pay

## 2023-09-10 ENCOUNTER — Emergency Department (HOSPITAL_COMMUNITY)
Admission: EM | Admit: 2023-09-10 | Discharge: 2023-09-10 | Disposition: A | Discharge status: Home / Self Care | Payer: Medicaid Other | Attending: Emergency Medicine | Admitting: Emergency Medicine

## 2023-09-10 ENCOUNTER — Emergency Department (HOSPITAL_COMMUNITY): Payer: Medicaid Other

## 2023-09-10 ENCOUNTER — Encounter (HOSPITAL_COMMUNITY): Payer: Self-pay

## 2023-09-10 DIAGNOSIS — R0602 Shortness of breath: Secondary | ICD-10-CM | POA: Diagnosis present

## 2023-09-10 LAB — BASIC METABOLIC PANEL
Anion gap: 11 (ref 5–15)
BUN: 13 mg/dL (ref 6–20)
CO2: 21 mmol/L — ABNORMAL LOW (ref 22–32)
Calcium: 9.1 mg/dL (ref 8.9–10.3)
Chloride: 105 mmol/L (ref 98–111)
Creatinine, Ser: 0.87 mg/dL (ref 0.44–1.00)
GFR, Estimated: >60 mL/min (ref >60)
Glucose, Bld: 112 mg/dL — ABNORMAL HIGH (ref 70–99)
Potassium: 3.5 mmol/L (ref 3.5–5.1)
Sodium: 137 mmol/L (ref 135–145)

## 2023-09-10 LAB — CBC
HCT: 38.5 % (ref 36.0–46.0)
Hemoglobin: 12.4 g/dL (ref 12.0–15.0)
MCH: 29 pg (ref 26.0–34.0)
MCHC: 32.2 g/dL (ref 30.0–36.0)
MCV: 90 fL (ref 80.0–100.0)
Platelets: 291 10*3/uL (ref 150–400)
RBC: 4.28 MIL/uL (ref 3.87–5.11)
RDW: 13.4 % (ref 11.5–15.5)
WBC: 9.9 10*3/uL (ref 4.0–10.5)
nRBC: 0 % (ref 0.0–0.2)

## 2023-09-10 LAB — HCG, SERUM, QUALITATIVE: Preg, Serum: NEGATIVE

## 2023-09-10 LAB — TROPONIN I (HIGH SENSITIVITY): Troponin I (High Sensitivity): 8 ng/L (ref <18)

## 2023-09-10 NOTE — Progress Notes (Signed)
   09/10/23 1000  [facility] Triage Screening (Walk-ins at [facility] only)  How Did You Hear About [place]? Family/Friend  What Is the Reason for Your Visit/Call Today? Amya Hauck is a 37 year old female presenting to [facility] presenting concerns with worsening depression. Pt reports that she has been experiencing worsening depression for many years. Pt denies taking medications at this time. Pt is currently looking for a therapist at this time and possilby outpatient therapy. Pt has never been evaluated before and is looking for any resources she can get. Pt is unable to open up about what excatly is causing her to be depressed. Pt is very tearful throughout triage. Pt states, "I just dont know, I feel very depressed and not myself". Pt denies substance use, SI, HI and AVH currently.  How Long Has This Been Causing You Problems? > than 6 months  Have You Recently Had Any Thoughts About Hurting Yourself? No  Are You Planning to Commit Suicide/Harm Yourself At This time? No  Have you Recently Had Thoughts About Hurting Someone [name]? No  Are You Planning To Harm Someone At This Time? No  Are you currently experiencing any auditory, visual or other hallucinations? No  Have You Used Any Alcohol or Drugs in the Past 24 Hours? No  Do you have any current medical co-morbidities that require immediate attention? No  Clinician description of patient physical appearance/behavior: tearful, cooperative  What Do You Feel Would Help You the Most Today? Stress Management;Medication(s)  If access to [facility] was not available, would you have sought care in the Emergency Department? No  Determination of Need Routine (7 days)  Options For Referral Intensive Outpatient Therapy;Medication Management

## 2023-09-10 NOTE — Progress Notes (Signed)
   09/10/23 1000  [facility] Triage Screening (Walk-ins at [facility] only)  How Did You Hear About [place]? Family/Friend  What Is the Reason for Your Visit/Call Today? Dana Edwards is a 37 year old female presenting to [facility] accompanied by her mother with concerns of worsening depression. Pt reports that she has been experiencing worsening depression for many years. Pt denies taking medications at this time. Pt is currently looking for a therapist at this time and possilby outpatient therapy. Pt has never been evaluated before and is looking for any resources she can get. Pt is unable to open up about what excatly is causing her to be depressed. Pt is very tearful throughout triage. Pt states, "I just dont know, I feel very depressed and not myself". Pt denies substance use, SI, HI and AVH currently.  How Long Has This Been Causing You Problems? > than 6 months  Have You Recently Had Any Thoughts About Hurting Yourself? No  Are You Planning to Commit Suicide/Harm Yourself At This time? No  Have you Recently Had Thoughts About Hurting Someone Dana Edwards? No  Are You Planning To Harm Someone At This Time? No  Are you currently experiencing any auditory, visual or other hallucinations? No  Have You Used Any Alcohol or Drugs in the Past 24 Hours? No  Do you have any current medical co-morbidities that require immediate attention? No  Clinician description of patient physical appearance/behavior: tearful, cooperative  What Do You Feel Would Help You the Most Today? Stress Management;Medication(s)  If access to [facility] was not available, would you have sought care in the Emergency Department? No  Determination of Need Routine (7 days)  Options For Referral Intensive Outpatient Therapy;Medication Management

## 2023-09-10 NOTE — Discharge Instructions (Signed)
There is no clear cause for why you are having shortness of breath and the chest pain.  Follow-up with your primary care physician.  If you develop recurrent, continued, or worsening chest pain, shortness of breath, fever, vomiting, abdominal or back pain, or any other new/concerning symptoms then return to the ER for evaluation.

## 2023-09-10 NOTE — ED Triage Notes (Signed)
Pt c/o right sided chest pain and [id number]. Pt came into triage hyperventilating. Pt was breathing normal when I came into room, but now that I'm talking to her, she is breathing faster.

## 2023-09-10 NOTE — ED Provider Notes (Signed)
[place] EM[facility]PART[facility] AT [facility] Provider Note   CSN: [id number] Arrival date & time: 09/10/23  1135     History  Chief Complaint  Patient presents with   Chest Pain   Shortness of Breath    Dana Edwards is a 37 y.o. female.  HPI 37 year old female presents with shortness of breath and chest pain.  She has been dealing with similar symptoms on and off for a year.  Has right-sided chest pain and feels like she cannot catch her breath.  Typically associates it with an anxiety attack and thinks she had an anxiety attack earlier.  This morning has developed worse shortness of breath and chest pain on the right side than she typically experiences.  However otherwise the symptoms are similar to what she has been going through for the past year.  No cough, fever, recent travel or surgery, unilateral leg swelling. No cardiac history.  She had been put on oxygen for comfort and felt like that made her feel better.  Home Medications Prior to Admission medications   Medication Sig Start Date End Date Taking? Authorizing Provider  benzonatate (TESSALON) 100 MG capsule Take 1 capsule (100 mg total) by mouth every 8 (eight) hours. 08/31/20   Harlene Salts A, PA-C  fluticasone (FLONASE) 50 MCG/ACT nasal spray Place 1 spray into both nostrils daily. 05/09/18   Alene Mires, NP  ibuprofen (ADVIL) 600 MG tablet Take 1 tablet (600 mg total) by mouth every 6 (six) hours as needed for mild pain or moderate pain. 07/03/21   Gustavus Bryant, FNP      Allergies    Patient has no known allergies.    Review of Systems   Review of Systems  Constitutional:  Negative for fever.  Respiratory:  Positive for shortness of breath. Negative for cough.   Cardiovascular:  Positive for chest pain. Negative for leg swelling.  Gastrointestinal:  Negative for abdominal pain.  Psychiatric/Behavioral:  The patient is nervous/anxious.     Physical Exam Updated Vital Signs BP (!)  147/97   Pulse 87   Temp 98 F (36.7 C) (Oral)   Resp (!) 22   Ht 5\' 7"  (1.702 m)   Wt 114 kg   SpO2 100%   BMI 39.36 kg/m  Physical Exam Vitals and nursing note reviewed.  Constitutional:      Appearance: She is well-developed.  HENT:     Head: Normocephalic and atraumatic.  Cardiovascular:     Rate and Rhythm: Normal rate and regular rhythm.     Heart sounds: Normal heart sounds.  Pulmonary:     Effort: Pulmonary effort is normal.     Breath sounds: Normal breath sounds.  Musculoskeletal:     Right lower leg: No tenderness. No edema.     Left lower leg: No tenderness. No edema.  Skin:    General: Skin is warm and dry.  Neurological:     Mental Status: She is alert.     ED Results / Procedures / Treatments   Labs (all labs ordered are listed, but only abnormal results are displayed) Labs Reviewed  BASIC METABOLIC PANEL - Abnormal; Notable for the following components:      Result Value   CO2 21 (*)    Glucose, Bld 112 (*)    All other components within normal limits  CBC  HCG, SERUM, QUALITATIVE  TROPONIN I (HIGH SENSITIVITY)    EKG EKG Interpretation Date/Time:  Wednesday September 10 2023 11:56:03  EDT Ventricular Rate:  79 PR Interval:  164 QRS Duration:  80 QT Interval:  362 QTC Calculation: 415 R Axis:   17  Text Interpretation: Normal sinus rhythm Nonspecific ST abnormality  nonspecific changes similar to 2021 Confirmed by Pricilla Loveless [phone]) on 09/10/2023 2:26:00 PM  Radiology DG Chest 2 View  Result Date: 09/10/2023 CLINICAL DATA:  Chest pain. EXAM: CHEST - 2 VIEW COMPARISON:  09/04/2020. FINDINGS: Low lung volume. Bilateral lung fields are clear. Bilateral costophrenic angles are clear. Normal cardio-mediastinal silhouette. No acute osseous abnormalities. The soft tissues are within normal limits. There are surgical clips in the right upper quadrant, typical of a previous cholecystectomy. IMPRESSION: 1. Low lung volume. No active cardiopulmonary  disease. Electronically Signed   By: Jules Schick M.D.   On: 09/10/2023 13:26    Procedures Procedures    Medications Ordered in ED Medications - No data to display  ED Course/ Medical Decision Making/ A&P                                 Medical Decision Making Amount and/or Complexity of Data Reviewed Labs: ordered.    Details: Normal troponin.  Unremarkable labs Radiology: ordered and independent interpretation performed.    Details: No pneumonia or pneumothorax ECG/medicine tests: ordered and independent interpretation performed.    Details: Nonspecific changes but unchanged since 2021   Patient is presenting with acute on chronic chest pain and shortness of breath.  Is quite atypical.  I have a low suspicion of ACS.  She is also PERC negative and I have a low suspicion of PE.  Lungs are clear on exam.  She feels like it might be anxiety related and I discussed that while this may be the case, she should still follow-up with her PCP.  However given low concern for serious pathology such as ACS, PE, dissection, etc., I think is reasonable to for her to follow-up as an outpatient.  Given that her symptoms seem to be worse this morning, I did discuss we can do the second troponin per protocol but at this point she would like to go home and feels a little better.  She is currently off oxygen and her O2 sat is 100%.  Will discharge home with return precautions.        Final Clinical Impression(s) / ED Diagnoses Final diagnoses:  Shortness of breath    Rx / DC Orders ED Discharge Orders     None         Pricilla Loveless, MD 09/10/23 1503

## 2023-09-10 NOTE — ED Notes (Signed)
Pt left facility before being assessed by a provider.

## 2023-09-10 NOTE — ED Notes (Signed)
Pt requested to speak with EDP again prior to discharge.  Dr. Criss Alvine to bedside to answer patient's questions.  Pt okayed for discharged.  Exited dept via wheelchair in NAD
